# Patient Record
Sex: Male | Born: 1975 | Hispanic: No | Marital: Married
Health system: Midwestern US, Academic
[De-identification: ages and names within clinical notes are randomized; demographics above are authoritative.]

---

## 2018-04-25 ENCOUNTER — Encounter: Admit: 2018-04-25 | Discharge: 2018-04-26

## 2018-05-17 ENCOUNTER — Encounter: Admit: 2018-05-17 | Discharge: 2018-05-18

## 2018-11-14 ENCOUNTER — Encounter: Admit: 2018-11-14 | Discharge: 2018-11-14 | Payer: BC Managed Care – HMO

## 2018-11-20 ENCOUNTER — Encounter: Admit: 2018-11-20 | Discharge: 2018-11-20 | Payer: BC Managed Care – HMO

## 2018-11-20 DIAGNOSIS — M549 Dorsalgia, unspecified: Principal | ICD-10-CM

## 2018-11-22 ENCOUNTER — Ambulatory Visit: Admit: 2018-11-22 | Discharge: 2018-11-22 | Payer: BC Managed Care – HMO

## 2018-11-22 ENCOUNTER — Encounter: Admit: 2018-11-22 | Discharge: 2018-11-22 | Payer: BC Managed Care – HMO

## 2018-11-22 DIAGNOSIS — M549 Dorsalgia, unspecified: Principal | ICD-10-CM

## 2018-11-22 DIAGNOSIS — M48062 Spinal stenosis, lumbar region with neurogenic claudication: ICD-10-CM

## 2018-11-22 DIAGNOSIS — T7840XA Allergy, unspecified, initial encounter: ICD-10-CM

## 2018-11-22 DIAGNOSIS — F419 Anxiety disorder, unspecified: Principal | ICD-10-CM

## 2018-11-22 NOTE — Progress Notes
Date of Service: 11/22/2018     Subjective:               History of Present Illness    Troy Matthews is a 43 y.o. male.     Review of Systems   Musculoskeletal: Positive for arthralgias, back pain and neck stiffness.   All other systems reviewed and are negative.        Objective:         ??? diclofenac sodium DR (VOLTAREN) 75 mg tablet Take 75 mg by mouth twice daily.   ??? LORazepam (ATIVAN) 1 mg tablet Take 1 mg by mouth as Needed for Nausea, Vomiting or Other....   ??? pantoprazole DR (PROTONIX) 40 mg tablet Take 40 mg by mouth daily.     Vitals:    11/22/18 0813   BP: (!) 132/98   Pulse: 103   SpO2: 98%   Weight: 99.3 kg (219 lb)   Height: 172.7 cm (68)   PainSc: Six     Body mass index is 33.3 kg/m???.       Physical Exam  Ortho Exam         Assessment and Plan:    Oswestry Total Score:: 54

## 2018-11-22 NOTE — Progress Notes
Date of Service: 11/22/2018    Subjective:             Troy Matthews is a 43 y.o. male.    History of Present Illness  Mr. Pina is a 43 year old male who presents for new consultation of low back pain.  Patient has a past medical history of anxiety.  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  Reports pain predominantly lumbar region which occasional pain radiating to posterior buttocks and thigh region.  He reports about 7 months ago he was lifting a washer and dryer and developed worsening of his low back pain.  Since then he reports the pain has intensified.  He describes sharp low back pain that radiates to the bilateral buttocks and posterior thigh regions.  Reports the pain is intermittent and not constant.  He reports his alleviated by laying flat.  Reports is worse with standing upright and ambulating which he has to stop and rest for a while and it improves.  He denies bowel or bladder changes.  He denies lower extremity weakness.  He has attempted several conservative treatment options which have not provided long-term relief.  He has attempted physical therapy in 3 epidural steroid injections that which provided minimal relief at best.  He brings an MRI of the lumbar spine which is available for review today.     Review of Systems   Musculoskeletal: Positive for arthralgias, back pain and myalgias.   All other systems reviewed and are negative.    Medical History:   Diagnosis Date   ??? Allergies    ??? Anxiety      Surgical History:   Procedure Laterality Date   ??? WISDOM TEETH EXTRACTION       No Known Allergies      Objective:         ??? diclofenac sodium DR (VOLTAREN) 75 mg tablet Take 75 mg by mouth twice daily.   ??? LORazepam (ATIVAN) 1 mg tablet Take 1 mg by mouth as Needed for Nausea, Vomiting or Other....   ??? pantoprazole DR (PROTONIX) 40 mg tablet Take 40 mg by mouth daily.     Vitals:    11/22/18 0813   BP: (!) 132/98   Pulse: 103 SpO2: 98%   Weight: 99.3 kg (219 lb)   Height: 172.7 cm (68)   PainSc: Six     Body mass index is 33.3 kg/m???.     Physical Exam  Vitals signs reviewed.   Constitutional:       Appearance: Normal appearance.   HENT:      Head: Normocephalic and atraumatic.      Nose: Nose normal.   Eyes:      Extraocular Movements: Extraocular movements intact.      Pupils: Pupils are equal, round, and reactive to light.   Neck:      Musculoskeletal: Normal range of motion.   Cardiovascular:      Rate and Rhythm: Normal rate and regular rhythm.   Pulmonary:      Effort: Pulmonary effort is normal.      Breath sounds: Normal breath sounds.   Abdominal:      Palpations: Abdomen is soft.   Musculoskeletal: Normal range of motion.         General: No swelling, tenderness or deformity.   Skin:     General: Skin is warm and dry.      Capillary Refill:  Capillary refill takes less than 2 seconds.   Neurological:      Mental Status: He is alert. Mental status is at baseline.      Cranial Nerves: No cranial nerve deficit.      Sensory: No sensory deficit.      Motor: No weakness.      Coordination: Coordination normal.      Gait: Gait normal.      Deep Tendon Reflexes: Reflexes normal.      Comments: 5/5 strength bilateral upper extremities in shoulder abduction, EF, EE, wrist flexion, finger adduction  5/5 strength in HF, KE, KF, Df, PF, EHL     Psychiatric:         Mood and Affect: Mood normal.       MRI lumbar spine without contrast: Outside MRI lumbar spine reviewed.  Demonstrates multilevel spondylosis most prominently with L3-4 moderate central disc protrusion with ligamentum flavum hypertrophy compressing the thecal sac.  There is no obvious acute fractures.  There is normal alignment of the lumbar spine.       Assessment and Plan:  43 year old male who presents with chronic low back pain and bilateral buttock posterior thigh pain.  Experienced a exacerbation of his present

## 2018-12-01 ENCOUNTER — Ambulatory Visit: Admit: 2018-12-01 | Discharge: 2018-12-02 | Payer: BC Managed Care – HMO

## 2018-12-01 ENCOUNTER — Encounter: Admit: 2018-12-01 | Discharge: 2018-12-01 | Payer: BC Managed Care – HMO

## 2018-12-01 ENCOUNTER — Ambulatory Visit: Admit: 2018-12-01 | Discharge: 2018-12-01 | Payer: BC Managed Care – HMO

## 2018-12-01 DIAGNOSIS — F419 Anxiety disorder, unspecified: Principal | ICD-10-CM

## 2018-12-01 DIAGNOSIS — M1990 Unspecified osteoarthritis, unspecified site: ICD-10-CM

## 2018-12-01 DIAGNOSIS — T7840XA Allergy, unspecified, initial encounter: ICD-10-CM

## 2018-12-01 LAB — COMPREHENSIVE METABOLIC PANEL
Lab: 0.3 mg/dL — ABNORMAL LOW (ref 0.3–1.2)
Lab: 0.9 mg/dL (ref 0.4–1.24)
Lab: 10 mg/dL (ref 8.5–10.6)
Lab: 103 MMOL/L (ref 98–110)
Lab: 11 (ref 3–12)
Lab: 139 MMOL/L — ABNORMAL HIGH (ref 137–147)
Lab: 14 mg/dL (ref 7–25)
Lab: 20 U/L (ref 7–40)
Lab: 25 MMOL/L (ref 21–30)
Lab: 3.7 MMOL/L (ref 3.5–5.1)
Lab: 36 U/L (ref 7–56)
Lab: 4.9 g/dL (ref 3.5–5.0)
Lab: 8.1 g/dL — ABNORMAL HIGH (ref 6.0–8.0)
Lab: 81 mg/dL (ref 70–100)
Lab: 85 U/L (ref 25–110)
Lab: >60 mL/min (ref >60)
Lab: >60 mL/min (ref >60)

## 2018-12-01 LAB — CBC: Lab: 9.7 10*3/uL (ref 4.5–11.0)

## 2018-12-02 DIAGNOSIS — M549 Dorsalgia, unspecified: Principal | ICD-10-CM

## 2018-12-08 ENCOUNTER — Encounter: Admit: 2018-12-08 | Discharge: 2018-12-08 | Payer: BC Managed Care – HMO

## 2018-12-08 ENCOUNTER — Ambulatory Visit: Admit: 2018-12-08 | Discharge: 2018-12-08 | Payer: BC Managed Care – HMO

## 2018-12-08 DIAGNOSIS — M5126 Other intervertebral disc displacement, lumbar region: ICD-10-CM

## 2018-12-08 DIAGNOSIS — F419 Anxiety disorder, unspecified: ICD-10-CM

## 2018-12-08 DIAGNOSIS — M48062 Spinal stenosis, lumbar region with neurogenic claudication: Principal | ICD-10-CM

## 2018-12-08 DIAGNOSIS — M1990 Unspecified osteoarthritis, unspecified site: ICD-10-CM

## 2018-12-08 DIAGNOSIS — T7840XA Allergy, unspecified, initial encounter: ICD-10-CM

## 2018-12-08 MED ORDER — TRIAMCINOLONE ACETONIDE 40 MG/ML IJ SUSP
0 refills | Status: DC
Start: 2018-12-08 — End: 2018-12-08
  Administered 2018-12-08: 22:00:00 40 mg via INTRAMUSCULAR

## 2018-12-08 MED ORDER — HYDROMORPHONE (PF) 2 MG/ML IJ SYRG
0 refills | Status: DC
Start: 2018-12-08 — End: 2018-12-08
  Administered 2018-12-08: 22:00:00 1 mg via INTRAVENOUS

## 2018-12-08 MED ORDER — MIDAZOLAM 1 MG/ML IJ SOLN
INTRAVENOUS | 0 refills | Status: DC
Start: 2018-12-08 — End: 2018-12-08
  Administered 2018-12-08: 21:00:00 2 mg via INTRAVENOUS

## 2018-12-08 MED ORDER — LIDOCAINE (PF) 10 MG/ML (1 %) IJ SOLN
.1-2 mL | INTRAMUSCULAR | 0 refills | Status: DC | PRN
Start: 2018-12-08 — End: 2018-12-09
  Administered 2018-12-08: 19:00:00 0.2 mL via INTRAMUSCULAR

## 2018-12-08 MED ORDER — PROMETHAZINE 25 MG/ML IJ SOLN
6.25 mg | INTRAVENOUS | 0 refills | Status: DC | PRN
Start: 2018-12-08 — End: 2018-12-09

## 2018-12-08 MED ORDER — DEXTRAN 70-HYPROMELLOSE (PF) 0.1-0.3 % OP DPET
0 refills | Status: DC
Start: 2018-12-08 — End: 2018-12-08
  Administered 2018-12-08: 21:00:00 2 [drp] via OPHTHALMIC

## 2018-12-08 MED ORDER — DEXAMETHASONE SODIUM PHOSPHATE 4 MG/ML IJ SOLN
INTRAVENOUS | 0 refills | Status: DC
Start: 2018-12-08 — End: 2018-12-08
  Administered 2018-12-08: 21:00:00 4 mg via INTRAVENOUS

## 2018-12-08 MED ORDER — PROPOFOL INJ 10 MG/ML IV VIAL
INTRAVENOUS | 0 refills | Status: DC
Start: 2018-12-08 — End: 2018-12-08
  Administered 2018-12-08: 21:00:00 150 mg via INTRAVENOUS

## 2018-12-08 MED ORDER — FENTANYL CITRATE (PF) 50 MCG/ML IJ SOLN
50 ug | INTRAVENOUS | 0 refills | Status: DC | PRN
Start: 2018-12-08 — End: 2018-12-09

## 2018-12-08 MED ORDER — HALOPERIDOL LACTATE 5 MG/ML IJ SOLN
1 mg | Freq: Once | INTRAVENOUS | 0 refills | Status: DC | PRN
Start: 2018-12-08 — End: 2018-12-09

## 2018-12-08 MED ORDER — BUPIVACAINE-EPINEPHRINE 0.25 %-1:200,000 IJ SOLN
0 refills | Status: DC
Start: 2018-12-08 — End: 2018-12-08
  Administered 2018-12-08: 22:00:00 2 mL via INTRAMUSCULAR

## 2018-12-08 MED ORDER — OXYCODONE 5 MG PO TAB
5-10 mg | ORAL_TABLET | ORAL | 0 refills | 6.00000 days | Status: AC | PRN
Start: 2018-12-08 — End: 2019-02-15
  Filled 2018-12-08 (×2): qty 25, 2d supply, fill #1

## 2018-12-08 MED ORDER — THROMBIN (BOVINE) 5,000 UNIT TP SOLR
0 refills | Status: DC
Start: 2018-12-08 — End: 2018-12-08
  Administered 2018-12-08: 22:00:00 5000 [IU] via TOPICAL

## 2018-12-08 MED ORDER — ACETAMINOPHEN 500 MG PO TAB
1000 mg | Freq: Once | ORAL | 0 refills | Status: CP
Start: 2018-12-08 — End: ?
  Administered 2018-12-08: 19:00:00 1000 mg via ORAL

## 2018-12-08 MED ORDER — OXYCODONE 5 MG PO TAB
5-10 mg | Freq: Once | ORAL | 0 refills | Status: CP | PRN
Start: 2018-12-08 — End: ?
  Administered 2018-12-08: 22:00:00 10 mg via ORAL

## 2018-12-08 MED ORDER — SODIUM CHLORIDE 0.9 % IV SOLP
1000 mL | INTRAVENOUS | 0 refills | Status: DC
Start: 2018-12-08 — End: 2018-12-09

## 2018-12-08 MED ORDER — SUGAMMADEX 100 MG/ML IV SOLN
INTRAVENOUS | 0 refills | Status: DC
Start: 2018-12-08 — End: 2018-12-08
  Administered 2018-12-08: 22:00:00 200 mg via INTRAVENOUS

## 2018-12-08 MED ORDER — PROPOFOL 10 MG/ML IV EMUL (INFUSION)(AM)(OR)
INTRAVENOUS | 0 refills | Status: DC
Start: 2018-12-08 — End: 2018-12-08
  Administered 2018-12-08: 21:00:00 150 ug/kg/min via INTRAVENOUS

## 2018-12-08 MED ORDER — FENTANYL CITRATE (PF) 50 MCG/ML IJ SOLN
25 ug | INTRAVENOUS | 0 refills | Status: DC | PRN
Start: 2018-12-08 — End: 2018-12-09

## 2018-12-08 MED ORDER — LIDOCAINE (PF) 200 MG/10 ML (2 %) IJ SYRG
0 refills | Status: DC
Start: 2018-12-08 — End: 2018-12-08
  Administered 2018-12-08: 21:00:00 100 mg via INTRAVENOUS

## 2018-12-08 MED ORDER — BACITRACIN 50,000 UN NS 500 ML IRR BOT (OR)
0 refills | Status: DC
Start: 2018-12-08 — End: 2018-12-08
  Administered 2018-12-08 (×2): 500 mL

## 2018-12-08 MED ORDER — FENTANYL CITRATE (PF) 50 MCG/ML IJ SOLN
0 refills | Status: DC
Start: 2018-12-08 — End: 2018-12-08
  Administered 2018-12-08: 21:00:00 250 ug via INTRAVENOUS

## 2018-12-08 MED ORDER — GABAPENTIN 300 MG PO CAP
300 mg | Freq: Once | ORAL | 0 refills | Status: CP
Start: 2018-12-08 — End: ?
  Administered 2018-12-08: 19:00:00 300 mg via ORAL

## 2018-12-08 MED ORDER — ONDANSETRON HCL (PF) 4 MG/2 ML IJ SOLN
INTRAVENOUS | 0 refills | Status: DC
Start: 2018-12-08 — End: 2018-12-08
  Administered 2018-12-08: 21:00:00 4 mg via INTRAVENOUS

## 2018-12-08 MED ADMIN — SODIUM CHLORIDE 0.9 % IV SOLP [27838]: 1000 mL | INTRAVENOUS | @ 19:00:00 | Stop: 2018-12-08 | NDC 00338004904

## 2018-12-08 NOTE — Anesthesia Post-Procedure Evaluation
Post-Anesthesia Evaluation    Name: Troy Matthews      MRN: [id number]     DOB: [birth year]     Age: 43 y.o.     Sex: male   __________________________________________________________________________     Procedure Date: 12/08/2018  Procedure(s):  MINIMALLY INVASIVE LUMBAR 3 LAMINECTOMY      Surgeon: Surgeon(s):  Pablo Corona de [place], MD    Post-Anesthesia Vitals  BP: 131/93 (02/21 1645)  Temp: 36.6 ???C (97.9 ???F) (02/21 1645)  Pulse: 99 (02/21 1645)  Respirations: 12 PER MINUTE (02/21 1645)  SpO2: 97 % (02/21 1645)  SpO2 Pulse: 100 (02/21 1645)   Vitals Value Taken Time   BP 131/93 12/08/2018  4:45 PM   Temp 36.6 ???C (97.9 ???F) 12/08/2018  4:45 PM   Pulse 99 12/08/2018  4:45 PM   Respirations 12 PER MINUTE 12/08/2018  4:45 PM   SpO2 97 % 12/08/2018  4:45 PM         Post Anesthesia Evaluation Note    Evaluation location: Pre/Post  Patient participation: recovered; patient participated in evaluation  Level of consciousness: alert    Pain score: 3 (Tolerable per patient)  Pain management: adequate    Hydration: normovolemia  Temperature: 36.0???C - 38.4???C  Airway patency: adequate    Perioperative Events       Post-op nausea and vomiting: no PONV    Postoperative Status  Cardiovascular status: hemodynamically stable  Respiratory status: spontaneous ventilation  Follow-up needed: none        Perioperative Events  Perioperative Event: No  Emergency Case Activation: No

## 2018-12-10 ENCOUNTER — Encounter: Admit: 2018-12-10 | Discharge: 2018-12-10 | Payer: BC Managed Care – HMO

## 2018-12-10 DIAGNOSIS — F419 Anxiety disorder, unspecified: Principal | ICD-10-CM

## 2018-12-10 DIAGNOSIS — T7840XA Allergy, unspecified, initial encounter: ICD-10-CM

## 2018-12-10 DIAGNOSIS — M1990 Unspecified osteoarthritis, unspecified site: ICD-10-CM

## 2018-12-12 ENCOUNTER — Encounter: Admit: 2018-12-12 | Discharge: 2018-12-12 | Payer: BC Managed Care – HMO

## 2018-12-12 NOTE — Telephone Encounter
Patient called to request a refill of his oxycodone.  He states he is still having some pain when he is up moving around.    Patient informed Dr. Earlene Plater will be notified of the request for pain medication refill.

## 2018-12-13 ENCOUNTER — Encounter: Admit: 2018-12-13 | Discharge: 2018-12-13 | Payer: BC Managed Care – HMO

## 2018-12-13 MED ORDER — HYDROCODONE-ACETAMINOPHEN 5-325 MG PO TAB
1-2 | ORAL_TABLET | ORAL | 0 refills | 30.00000 days | Status: AC | PRN
Start: 2018-12-13 — End: 2019-01-01

## 2018-12-25 ENCOUNTER — Encounter: Admit: 2018-12-25 | Discharge: 2018-12-25 | Payer: BC Managed Care – HMO

## 2018-12-25 NOTE — Telephone Encounter
Pt called scheduling line to cancel POV for a wound check due to being ill.  Scheduling dept rescheduled appt to 01/01/19  I attempted to call pt back no answer.  Lm on pts vm to call clinic to discuss general questions about incisional care and rule out any concerns of infection.  Clinic number provided on pts vm  Call ended

## 2019-01-01 ENCOUNTER — Ambulatory Visit: Admit: 2019-01-01 | Discharge: 2019-01-02 | Payer: BC Managed Care – HMO

## 2019-01-01 ENCOUNTER — Encounter: Admit: 2019-01-01 | Discharge: 2019-01-01 | Payer: BC Managed Care – HMO

## 2019-01-01 DIAGNOSIS — F419 Anxiety disorder, unspecified: Principal | ICD-10-CM

## 2019-01-01 DIAGNOSIS — M1990 Unspecified osteoarthritis, unspecified site: ICD-10-CM

## 2019-01-01 DIAGNOSIS — Z5189 Encounter for other specified aftercare: Principal | ICD-10-CM

## 2019-01-01 DIAGNOSIS — T7840XA Allergy, unspecified, initial encounter: ICD-10-CM

## 2019-01-01 MED ORDER — HYDROCODONE-ACETAMINOPHEN 5-325 MG PO TAB
1 | ORAL_TABLET | ORAL | 0 refills | 30.00000 days | Status: AC | PRN
Start: 2019-01-01 — End: 2019-05-23

## 2019-01-01 NOTE — Progress Notes
Neurosurgery Clinic Follow-up    Patient Active Problem List    Diagnosis Date Noted   ??? 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 Earlene Plater on 12/08/18. He reports his anterior thigh pain has decreased.  He continues to have moderate levels of bilateral hip and lumbar pain.  He would like a pain medication refill.  He is eating/drinking well.  Denies constipation.   Denies redness/drainage/swelling of his incision.      Physical Exam     Alert/Fully Oriented  Neuro Intact and Stable  Motor/Strength 5/5  Gait Steady   Incision edges well approximated.  No S/S of infection noted.     Mr. General returns today for 2 wk f/u.  Overall, he's doing well. Incision healing well.  Discussed post op wound care.   He endorses continued incisional pain.  Will give him one small refill of Norco.   Encouraged to take Tylenol unless he's having moderate pain   Encouraged to increase his activity and begin walking small amounts.  He should refrain from lifting more than 10# and only minimal twisting or bending.  He voices understanding.  He will return for surgical follow up with Dr. Earlene Plater on 01/24/19.          Please call [phone] with questions or concern    Troy Laurence, APRN-NP

## 2019-01-17 ENCOUNTER — Encounter: Admit: 2019-01-17 | Discharge: 2019-01-17 | Payer: BC Managed Care – HMO

## 2019-01-24 ENCOUNTER — Encounter: Admit: 2019-01-24 | Discharge: 2019-01-24 | Payer: BC Managed Care – HMO

## 2019-01-24 ENCOUNTER — Ambulatory Visit: Admit: 2019-01-24 | Discharge: 2019-01-25 | Payer: BC Managed Care – HMO

## 2019-01-24 DIAGNOSIS — F419 Anxiety disorder, unspecified: Principal | ICD-10-CM

## 2019-01-24 DIAGNOSIS — T7840XA Allergy, unspecified, initial encounter: ICD-10-CM

## 2019-01-24 DIAGNOSIS — M1990 Unspecified osteoarthritis, unspecified site: ICD-10-CM

## 2019-01-24 NOTE — Progress Notes
???   oxyCODONE (ROXICODONE) 5 mg tablet Take one tablet to two tablets by mouth every 4 hours as needed for Pain   ??? pantoprazole DR (PROTONIX) 40 mg tablet Take 40 mg by mouth daily as needed.     Vitals:    01/24/19 1423   Weight: 98.9 kg (218 lb)   Height: 172.7 cm (68)   PainSc: Six     Body mass index is 33.15 kg/m???.              Assessment and Plan:  I think this patient has recovered well from his surgery.  His radicular and clot agenic pain has resolved.  He still has chronic back pain, for which I am prescribing physical therapy and referring him to pain management.  I will see him back on an as-needed basis.

## 2019-01-25 DIAGNOSIS — M48062 Spinal stenosis, lumbar region with neurogenic claudication: Principal | ICD-10-CM

## 2019-02-02 ENCOUNTER — Encounter: Admit: 2019-02-02 | Discharge: 2019-02-02 | Payer: BC Managed Care – HMO

## 2019-02-12 ENCOUNTER — Encounter: Admit: 2019-02-12 | Discharge: 2019-02-12 | Payer: BC Managed Care – HMO

## 2019-02-15 ENCOUNTER — Ambulatory Visit: Admit: 2019-02-15 | Discharge: 2019-02-16 | Payer: BC Managed Care – HMO

## 2019-02-15 ENCOUNTER — Encounter: Admit: 2019-02-15 | Discharge: 2019-02-15 | Payer: BC Managed Care – HMO

## 2019-02-15 DIAGNOSIS — M2550 Pain in unspecified joint: ICD-10-CM

## 2019-02-15 DIAGNOSIS — T7840XA Allergy, unspecified, initial encounter: ICD-10-CM

## 2019-02-15 DIAGNOSIS — M1990 Unspecified osteoarthritis, unspecified site: ICD-10-CM

## 2019-02-15 DIAGNOSIS — F419 Anxiety disorder, unspecified: Principal | ICD-10-CM

## 2019-02-15 DIAGNOSIS — M5136 Other intervertebral disc degeneration, lumbar region: ICD-10-CM

## 2019-02-15 DIAGNOSIS — M4800 Spinal stenosis, site unspecified: ICD-10-CM

## 2019-02-16 DIAGNOSIS — M48062 Spinal stenosis, lumbar region with neurogenic claudication: Principal | ICD-10-CM

## 2019-02-16 DIAGNOSIS — M5416 Radiculopathy, lumbar region: ICD-10-CM

## 2019-03-20 ENCOUNTER — Encounter: Admit: 2019-03-20 | Discharge: 2019-03-20

## 2019-03-20 ENCOUNTER — Ambulatory Visit: Admit: 2019-03-20 | Discharge: 2019-03-20

## 2019-03-20 DIAGNOSIS — M48062 Spinal stenosis, lumbar region with neurogenic claudication: Secondary | ICD-10-CM

## 2019-03-20 DIAGNOSIS — M5416 Radiculopathy, lumbar region: Secondary | ICD-10-CM

## 2019-03-23 ENCOUNTER — Encounter: Admit: 2019-03-23 | Discharge: 2019-03-23

## 2019-03-23 NOTE — Telephone Encounter
Patient is calling for results of the MRI   Currently scheduled for a Bil L3-4 TFESI on 03/29/2019

## 2019-03-26 NOTE — Telephone Encounter
will close the encounter as MRI results were sent by provider through mychart.

## 2019-03-28 ENCOUNTER — Encounter: Admit: 2019-03-28 | Discharge: 2019-03-28

## 2019-03-28 DIAGNOSIS — M48062 Spinal stenosis, lumbar region with neurogenic claudication: Secondary | ICD-10-CM

## 2019-03-29 ENCOUNTER — Encounter: Admit: 2019-03-29 | Discharge: 2019-03-29

## 2019-03-29 DIAGNOSIS — F419 Anxiety disorder, unspecified: Secondary | ICD-10-CM

## 2019-03-29 DIAGNOSIS — M4800 Spinal stenosis, site unspecified: Secondary | ICD-10-CM

## 2019-03-29 DIAGNOSIS — M1990 Unspecified osteoarthritis, unspecified site: Secondary | ICD-10-CM

## 2019-03-29 DIAGNOSIS — M2550 Pain in unspecified joint: Secondary | ICD-10-CM

## 2019-03-29 DIAGNOSIS — T7840XA Allergy, unspecified, initial encounter: Secondary | ICD-10-CM

## 2019-03-29 DIAGNOSIS — M48062 Spinal stenosis, lumbar region with neurogenic claudication: Principal | ICD-10-CM

## 2019-03-29 DIAGNOSIS — M5136 Other intervertebral disc degeneration, lumbar region: Secondary | ICD-10-CM

## 2019-03-29 MED ORDER — BUPIVACAINE (PF) 0.25 % (2.5 MG/ML) IJ SOLN
2 mL | Freq: Once | INTRAMUSCULAR | 0 refills | Status: CP
Start: 2019-03-29 — End: ?
  Administered 2019-03-29: 15:00:00 2 mL via INTRAMUSCULAR

## 2019-03-29 MED ORDER — IOHEXOL 240 MG IODINE/ML IV SOLN
2.5 mL | Freq: Once | EPIDURAL | 0 refills | Status: CP
Start: 2019-03-29 — End: ?
  Administered 2019-03-29: 15:00:00 2.5 mL via EPIDURAL

## 2019-03-29 MED ORDER — METHYLPREDNISOLONE ACETATE 80 MG/ML IJ SUSP
80 mg | Freq: Once | INTRAMUSCULAR | 0 refills | Status: CP
Start: 2019-03-29 — End: ?
  Administered 2019-03-29: 15:00:00 80 mg via INTRAMUSCULAR

## 2019-03-29 NOTE — Progress Notes
[facility]  INTERVENTIONAL PAIN PROCEDURE HISTORY AND PHYSICAL    Chief Complaint   Patient presents with   ??? Lower Back - Pain       HISTORY OF PRESENT ILLNESS:  pain    Medical History:   Diagnosis Date   ??? Allergies     Environmental, not current   ??? Anxiety    ??? Arthritis     Back   ??? 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 Joint pain Have had for long time   ??? Spinal stenosis 04/17/2018       Surgical History:   Procedure Laterality Date   ??? LAMINECTOMY  12/08/2018   ??? MINIMALLY INVASIVE LUMBAR 3 LAMINECTOMY N/A 12/08/2018    Performed by Pablo [place], MD at [facility]   ??? WISDOM TEETH EXTRACTION         family history includes Osteoporosis in his mother.    Social History     Socioeconomic History   ??? Marital status: Married     Spouse name: Not on file   ??? Number of children: Not on file   ??? Years of education: Not on file   ??? Highest education level: Not on file   Occupational History   ??? Not on file   Tobacco Use   ??? Smoking status: Current Some Day Smoker     Packs/day: 0.00     Years: 3.00     Pack years: 0.00     Types: Cigars   ??? Smokeless tobacco: Never Used   ??? Tobacco comment: Maybe 2-3 a week   Substance and Sexual Activity   ??? Alcohol use: Yes     Alcohol/week: 14.0 standard drinks     Types: 14 Cans of beer per week     Frequency: 4 or more times a week     Drinks per session: 1 or 2     Binge frequency: Daily or almost daily   ??? Drug use: Never   ??? Sexual activity: Yes     Partners: Female   Other Topics Concern   ??? Not on file   Social History Narrative   ??? Not on file       No Known Allergies    Vitals:    03/29/19 0931   BP: (!) 132/97   BP Source: Arm, Right Upper   Patient Position: Sitting   Pulse: 79   Resp: 18   Temp: 37 ???C (98.6 ???F)   TempSrc: Oral   SpO2: 99%   Weight: 102.1 kg (225 lb)   Height: 174 cm (68.5)   PainSc: Seven       REVIEW OF SYSTEMS: 10 point ROS obtained and negative except pain      PHYSICAL EXAM:General Appearance: moderately overweight, no distress Respiratory Effort: breathing comfortably, no respiratory distress   Pedal Pulses: normal symmetric pedal pulses   Lower Extremity Edema: no lower extremity edema   Abdominal Exam: mildly protuberant but soft, non-tender, no masses, bowel sounds normal   Gait & Station: walks without assistance   Muscle Strength: normal muscle tone   Orientation: oriented to time, place and person   Affect & Mood: appropriate and sustained affect   Language and Memory: patient responsive and seems to comprehend information   Neurologic Exam: neurological assessment grossly intact   Other: moves all extremities          IMPRESSION:    1. Lumbar stenosis with neurogenic claudication  2. Lumbar radiculopathy         PLAN: Lumbar Transforaminal Steroid Injection

## 2019-03-29 NOTE — Patient Instructions
Procedure Completed Today: Lumbar Epidural Steroid Injection    Important information following your procedure today: You may drive today    1. Pain relief may not be immediate. It is possible you may even experience an increase in pain during the first 24-48 hours followed by a gradual decrease of your pain.  2. Though the procedure is generally safe and complications are rare, we do ask that you be aware of any of the following:   ? Any swelling, persistent redness, new bleeding, or drainage from the site of the injection.  ? You should not experience a severe headache.  ? You should not run a fever over 101??? F.  ? New onset of sharp, severe back & or neck pain.  ? New onset of upper or lower extremity numbness or weakness.  ? New difficulty controlling bowel or bladder function after the injection.  ? New shortness of breath.    If any of these occur, please call to report this occurrence to a nurse at [phone]. If you are calling after 4:00 p.m., on weekends or holidays please call [phone] and ask to have the resident physician on call for the physician paged or go to your local emergency room.  3. You may experience soreness at the injection site. Ice can be applied at 20 minute intervals. Avoid application of direct heat, hot showers or hot tubs today.  4. Avoid strenuous activity today. You may resume your regular activities and exercise tomorrow.  5. Patients with diabetes may see an elevation in blood sugars for 7-10 days after the injection. It is important to pay close attention to your diet, check your blood sugars daily and report extreme elevations to the physician that treats your diabetes.  6. Patients taking a daily blood thinner can resume their regular dose this evening.  7. It is important that you take all medications ordered by your pain physician. Taking medication as ordered is an important part of your pain care plan. If you cannot continue the medication plan, please notify the physician.     Possible side effects to steroids that may occur:  ? Flushing or redness of the face  ? Irritability  ? Fluid retention  ? Change in women???s menses    The following medications were used: Bupivicaine  , Depomedrol and Contrast Dye

## 2019-03-29 NOTE — Procedures
Attending Surgeon: Jerilynn Som, MD    Anesthesia: Local    Pre-Procedure Diagnosis:   1. Lumbar stenosis with neurogenic claudication    2. Lumbar radiculopathy        Post-Procedure Diagnosis:   1. Lumbar stenosis with neurogenic claudication    2. Lumbar radiculopathy        [facility] INJECT SNRB/TFESI LUMBAR/SACRAL  Procedure: transforaminal epidural    Laterality: bilateral    Location: lumbar - L3-4      Consent:   Consent obtained: verbal and written  Consent given by: patient  Risks discussed: allergic reaction, bleeding, bruising and infection  Alternatives discussed: alternative treatment, delayed treatment and no treatment  Discussed with patient the purpose of the treatment/procedure, other ways of treating my condition, including no treatment/ procedure and the risks and benefits of the alternatives. Patient has decided to proceed with treatment/procedure.        Universal Protocol:  Relevant documents: relevant documents present and verified  Test results: test results available and properly labeled  Imaging studies: imaging studies available  Required items: required blood products, implants, devices, and special equipment available  Site marked: the operative site was marked  Patient identity confirmed: Patient identify confirmed verbally with patient.        Time out: Immediately prior to procedure a time out was called to verify the correct patient, procedure, equipment, support staff and site/side marked as required      Procedures Details:   Indications: pain and diagnostic evaluation   Prep: Betadine  Patient position: prone  Estimated Blood Loss: minimal  Specimens: none  Number of Levels: 2  Approach: right paramedian  Guidance: fluoroscopy  Contrast: Procedure confirmed with contrast under live fluoroscopy.  Needle and Epidural Catheter: pencil-tip  Needle size: 25 G  Injection procedure: Introduced needle  Patient tolerance: Patient tolerated the procedure well with no immediate complications. Pressure was applied, and hemostasis was accomplished.  Outcome: Pain improved  Comments: INTERVENTIONAL PAIN MANAGEMENT PROCEDURE REPORT    Lumbar Transforaminal Epidural Injection    Date of Service: @ED @    Procedure Title(s):    1. Bilateral L3-L4 transforaminal epidural injection   2. Intraoperative fluoroscopy     Attending Surgeon: @ENCPROVNMTITLE @    Anesthesia: Local           Anxiolysis No           Procedural Sedation No    Indications: Troy Matthews is a 43 y.o. male with a diagnosis of lumbar radicular pain, disc bulge. The patient's history and physical exam were reviewed. The risks, benefits and alternatives to the procedure were discussed, and all questions were answered to the patient's satisfaction. The patient agreed to proceed, and written informed consent was obtained.     Procedure in Detail: IV was started? No    The patient was brought into the procedure room and placed in the prone position on the fluoroscopy table. Standard monitors were placed, and vital signs were observed throughout the procedure. The area of the lumbar spine was prepped with betadine and draped in a sterile manner. The L3-L4 vertebral bodies were identified with AP fluoroscopy. An oblique view to the right then right  was obtained to better visualize the inferior junction of the pedicle and transverse process. The 6 o???clock position of the pedicle was marked and identified. The skin and subcutaneous tissues in the area were anesthetized with 1% lidocaine. A 25-gauge, 3.5 inch needle was directed toward the targeted point  under fluoroscopy until bone was contacted. The needle was then walked inferiorly until the neural foramen was entered. A lateral fluoroscopic view was then used to place the needle tip at the 10 o???clock position of the foramen.     Negative aspiration was confirmed, and 1ml of contrast was injected at each level. Appropriate neurograms were observed under live AP fluoroscopy with no noted vascular or intrathecal uptake. Then, after negative aspiration, a solution consisting of 1mL 0.25% bupivacaine and  40 (steroid) mg was easily injected at each level. The needles were removed with a 1% lidocaine flush. The patient???s back was cleaned and a bandage was placed over the needle insertion points.    The same procedure was performed on the opposite side? Yes    Disposition: The patient tolerated the procedure well, and there were no apparent complications. Vital signs remained stable througtout the procedure. The patient was taken to the recovery area where discharge instructions for the procedure were given.     Estimated Blood Loss: minimal    Specimens: none    Complications: none        Estimated blood loss: none or minimal  Specimens: none  Patient tolerated the procedure well with no immediate complications. Pressure was applied, and hemostasis was accomplished.

## 2019-03-29 NOTE — Progress Notes

## 2019-03-30 ENCOUNTER — Ambulatory Visit: Admit: 2019-03-29 | Discharge: 2019-03-30

## 2019-03-30 DIAGNOSIS — F17290 Nicotine dependence, other tobacco product, uncomplicated: Secondary | ICD-10-CM

## 2019-03-30 DIAGNOSIS — M5416 Radiculopathy, lumbar region: Secondary | ICD-10-CM

## 2019-05-09 ENCOUNTER — Encounter: Admit: 2019-05-09 | Discharge: 2019-05-09

## 2019-05-09 DIAGNOSIS — M48062 Spinal stenosis, lumbar region with neurogenic claudication: Secondary | ICD-10-CM

## 2019-05-23 ENCOUNTER — Encounter: Admit: 2019-05-23 | Discharge: 2019-05-23

## 2019-05-24 ENCOUNTER — Encounter: Admit: 2019-05-24 | Discharge: 2019-05-24

## 2019-05-24 DIAGNOSIS — M5416 Radiculopathy, lumbar region: Secondary | ICD-10-CM

## 2019-05-24 DIAGNOSIS — M48062 Spinal stenosis, lumbar region with neurogenic claudication: Secondary | ICD-10-CM

## 2019-05-24 DIAGNOSIS — M2550 Pain in unspecified joint: Secondary | ICD-10-CM

## 2019-05-24 DIAGNOSIS — M4800 Spinal stenosis, site unspecified: Secondary | ICD-10-CM

## 2019-05-24 DIAGNOSIS — F419 Anxiety disorder, unspecified: Secondary | ICD-10-CM

## 2019-05-24 DIAGNOSIS — T7840XA Allergy, unspecified, initial encounter: Secondary | ICD-10-CM

## 2019-05-24 DIAGNOSIS — M1990 Unspecified osteoarthritis, unspecified site: Secondary | ICD-10-CM

## 2019-05-24 DIAGNOSIS — M5136 Other intervertebral disc degeneration, lumbar region: Secondary | ICD-10-CM

## 2019-05-24 MED ORDER — METHYLPREDNISOLONE ACETATE 80 MG/ML IJ SUSP
80 mg | Freq: Once | INTRAMUSCULAR | 0 refills | Status: CP
Start: 2019-05-24 — End: ?
  Administered 2019-05-24: 18:00:00 80 mg via INTRAMUSCULAR

## 2019-05-24 MED ORDER — IOHEXOL 240 MG IODINE/ML IV SOLN
2.5 mL | Freq: Once | EPIDURAL | 0 refills | Status: CP
Start: 2019-05-24 — End: ?
  Administered 2019-05-24: 18:00:00 2.5 mL via EPIDURAL

## 2019-05-24 NOTE — Progress Notes

## 2019-05-24 NOTE — Procedures
Attending Surgeon: Jerilynn Som, MD    Anesthesia: Local    Pre-Procedure Diagnosis:   1. Lumbar stenosis with neurogenic claudication    2. Lumbar radiculopathy        Post-Procedure Diagnosis:   1. Lumbar stenosis with neurogenic claudication    2. Lumbar radiculopathy        [facility] INJECT INTERLAM LMBR/SAC  Procedure: epidural - interlaminar    Laterality: n/a    Location: lumbar ESI with imaging - L4-5      Consent:   Consent obtained: written  Consent given by: patient  Risks discussed: allergic reaction, bleeding, bruising, infection, no change or worsening in pain, reaction to medication and weakness  Alternatives discussed: alternative treatment, delayed treatment, no treatment and referral  Discussed with patient the purpose of the treatment/procedure, other ways of treating my condition, including no treatment/ procedure and the risks and benefits of the alternatives. Patient has decided to proceed with treatment/procedure.        Universal Protocol:  Relevant documents: relevant documents present and verified  Test results: test results available and properly labeled  Imaging studies: imaging studies available  Required items: required blood products, implants, devices, and special equipment available  Site marked: the operative site was marked  Patient identity confirmed: Patient identify confirmed verbally with patient.        Time out: Immediately prior to procedure a time out was called to verify the correct patient, procedure, equipment, support staff and site/side marked as required      Procedures Details:   Indications: pain   Prep: Betadine  Patient position: prone  Estimated Blood Loss: minimal  Specimens: none  Number of Levels: 1  Approach: midline  Guidance: fluoroscopy  Contrast: Procedure confirmed with contrast under live fluoroscopy.  Needle and Epidural Catheter: tuohy  Needle size: 18 G  Injection procedure: Incremental injection, Negative aspiration for blood and Introduced needle  Patient tolerance: Patient tolerated the procedure well with no immediate complications. Pressure was applied, and hemostasis was accomplished.  Outcome: Pain improved  Comments: 4ml NS and 80mg  depomedrol  No complications        Estimated blood loss: none or minimal  Specimens: none  Patient tolerated the procedure well with no immediate complications. Pressure was applied, and hemostasis was accomplished.

## 2019-05-24 NOTE — Patient Instructions
Procedure Completed Today: Lumbar Epidural Steroid Injection    Important information following your procedure today: You may drive today    1. Pain relief may not be immediate. It is possible you may even experience an increase in pain during the first 24-48 hours followed by a gradual decrease of your pain.  2. Though the procedure is generally safe and complications are rare, we do ask that you be aware of any of the following:   ? Any swelling, persistent redness, new bleeding, or drainage from the site of the injection.  ? You should not experience a severe headache.  ? You should not run a fever over 101??? F.  ? New onset of sharp, severe back & or neck pain.  ? New onset of upper or lower extremity numbness or weakness.  ? New difficulty controlling bowel or bladder function after the injection.  ? New shortness of breath.    If any of these occur, please call to report this occurrence to a nurse at [phone]. If you are calling after 4:00 p.m., on weekends or holidays please call [phone] and ask to have the resident physician on call for the physician paged or go to your local emergency room.  3. You may experience soreness at the injection site. Ice can be applied at 20 minute intervals. Avoid application of direct heat, hot showers or hot tubs today.  4. Avoid strenuous activity today. You may resume your regular activities and exercise tomorrow.  5. Patients with diabetes may see an elevation in blood sugars for 7-10 days after the injection. It is important to pay close attention to your diet, check your blood sugars daily and report extreme elevations to the physician that treats your diabetes.  6. Patients taking a daily blood thinner can resume their regular dose this evening.  7. It is important that you take all medications ordered by your pain physician. Taking medication as ordered is an important part of your pain care plan. If you cannot continue the medication plan, please notify the physician.     Possible side effects to steroids that may occur:  ? Flushing or redness of the face  ? Irritability  ? Fluid retention  ? Change in women???s menses    The following medications were used: Lidocaine , Depomedrol and Contrast Dye

## 2019-05-24 NOTE — Progress Notes
SPINE CENTER  INTERVENTIONAL PAIN PROCEDURE HISTORY AND PHYSICAL    Chief Complaint   Patient presents with   ??? Left Leg - Pain   ??? Procedure       HISTORY OF PRESENT ILLNESS:  pain    Medical History:   Diagnosis Date   ??? Allergies     Environmental, not current   ??? Anxiety    ??? Arthritis     Back   ??? 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 Joint pain Have had for long time   ??? Spinal stenosis 04/17/2018       Surgical History:   Procedure Laterality Date   ??? LAMINECTOMY  12/08/2018   ??? MINIMALLY INVASIVE LUMBAR 3 LAMINECTOMY N/A 12/08/2018    Performed by Pablo [place], MD at [facility]   ??? WISDOM TEETH EXTRACTION         family history includes Osteoporosis in his mother.    Social History     Socioeconomic History   ??? Marital status: Married     Spouse name: Not on file   ??? Number of children: Not on file   ??? Years of education: Not on file   ??? Highest education level: Not on file   Occupational History   ??? Not on file   Tobacco Use   ??? Smoking status: Current Some Day Smoker     Packs/day: 0.00     Years: 3.00     Pack years: 0.00     Types: Cigars   ??? Smokeless tobacco: Never Used   ??? Tobacco comment: Maybe 2-3 a week   Substance and Sexual Activity   ??? Alcohol use: Yes     Alcohol/week: 14.0 standard drinks     Types: 14 Cans of beer per week     Frequency: 4 or more times a week     Drinks per session: 1 or 2     Binge frequency: Daily or almost daily   ??? Drug use: Never   ??? Sexual activity: Yes     Partners: Female   Other Topics Concern   ??? Not on file   Social History Narrative   ??? Not on file       No Known Allergies    Vitals:    05/24/19 1147   BP: (!) 128/94   BP Source: Arm, Right Upper   Patient Position: Sitting   Pulse: 104   Resp: 16   Temp: 36.7 ???C (98.1 ???F)   TempSrc: Oral   SpO2: 98%   Weight: 100.2 kg (221 lb)   PainSc: Five       REVIEW OF SYSTEMS: 10 point ROS obtained and negative except pain      PHYSICAL EXAM:General Appearance: moderately overweight, no distress Respiratory Effort: breathing comfortably, no respiratory distress   Pedal Pulses: normal symmetric pedal pulses   Lower Extremity Edema: no lower extremity edema   Abdominal Exam: mildly protuberant but soft, non-tender, no masses, bowel sounds normal   Gait & Station: walks without assistance   Muscle Strength: normal muscle tone   Orientation: oriented to time, place and person   Affect & Mood: appropriate and sustained affect   Language and Memory: patient responsive and seems to comprehend information   Neurologic Exam: neurological assessment grossly intact   Other: moves all extremities          IMPRESSION:    1. Lumbar stenosis with neurogenic claudication    2. Lumbar  radiculopathy         PLAN: Lumbar Epidural Steroid Injection

## 2019-05-25 ENCOUNTER — Ambulatory Visit: Admit: 2019-05-24 | Discharge: 2019-05-25

## 2019-05-25 DIAGNOSIS — M48062 Spinal stenosis, lumbar region with neurogenic claudication: Secondary | ICD-10-CM

## 2019-05-25 DIAGNOSIS — F17290 Nicotine dependence, other tobacco product, uncomplicated: Secondary | ICD-10-CM

## 2019-05-25 DIAGNOSIS — M5416 Radiculopathy, lumbar region: Secondary | ICD-10-CM

## 2019-07-27 ENCOUNTER — Encounter: Admit: 2019-07-27 | Discharge: 2019-07-27 | Payer: BC Managed Care – HMO

## 2019-07-27 ENCOUNTER — Ambulatory Visit: Admit: 2019-07-27 | Discharge: 2019-07-27 | Payer: BC Managed Care – HMO

## 2019-07-27 DIAGNOSIS — M4800 Spinal stenosis, site unspecified: Secondary | ICD-10-CM

## 2019-07-27 DIAGNOSIS — M5416 Radiculopathy, lumbar region: Secondary | ICD-10-CM

## 2019-07-27 DIAGNOSIS — M48062 Spinal stenosis, lumbar region with neurogenic claudication: Secondary | ICD-10-CM

## 2019-07-27 DIAGNOSIS — F419 Anxiety disorder, unspecified: Secondary | ICD-10-CM

## 2019-07-27 DIAGNOSIS — M2550 Pain in unspecified joint: Secondary | ICD-10-CM

## 2019-07-27 DIAGNOSIS — M5136 Other intervertebral disc degeneration, lumbar region: Secondary | ICD-10-CM

## 2019-07-27 DIAGNOSIS — M1990 Unspecified osteoarthritis, unspecified site: Secondary | ICD-10-CM

## 2019-07-27 DIAGNOSIS — T7840XA Allergy, unspecified, initial encounter: Secondary | ICD-10-CM

## 2019-07-27 MED ORDER — GABAPENTIN 600 MG PO TAB
600 mg | ORAL_TABLET | ORAL | 3 refills | Status: AC
Start: 2019-07-27 — End: ?

## 2019-07-27 NOTE — Progress Notes
SPINE CENTER CLINIC NOTE       SUBJECTIVE:   Chronic back pain and leg pain  Patient was last seen in August for injections  He underwent L3 and L4 transforaminal injections and reports no significant relief from these treatments  Previously he underwent epidural injections in [place] with mild relief but not sustaining  Patient has undergone surgical treatment with Dr. Earlene Plater in neurosurgery with partial laminectomy at L3  His leg pain was mildly better after the treatment but his back pain persisted  He reports that his pain is been ongoing limiting his ability to walk and function and affecting his mood.  He has had underlying anxiety in the past and is currently getting treated with Valium by his primary care provider  Currently the patient is taking oxycodone 15 mg a couple to few times per day sparingly for pain control  He finds that the medication helps mildly but the significance of his pain does affect his functionality  He has undergone physical therapy in the past with limited relief    He comes in to discuss treatment options           Review of Systems   Constitutional: Negative.    HENT: Negative.    Eyes: Negative.    Respiratory: Negative.    Cardiovascular: Negative.    Gastrointestinal: Negative.    Endocrine: Negative.    Genitourinary: Negative.    Musculoskeletal: Positive for back pain and myalgias.   Skin: Negative.    Allergic/Immunologic: Negative.    Neurological: Negative.    Hematological: Negative.    Psychiatric/Behavioral: Negative.        Current Outpatient Medications:   ?  cyclobenzaprine (FLEXERIL) 10 mg tablet, Take 10 mg by mouth at bedtime daily., Disp: , Rfl:   ?  diazePAM (VALIUM) 5 mg tablet, Take 5 mg by mouth as Needed for Anxiety., Disp: , Rfl:   ?  duloxetine DR (CYMBALTA) 30 mg capsule, Take 30 mg by mouth daily., Disp: , Rfl:   ?  ETODOLAC PO, Take 400 mg by mouth twice daily as needed., Disp: , Rfl: ?  gabapentin (NEURONTIN) 600 mg tablet, Take one tablet by mouth every 8 hours. Indications: neuropathic pain, Disp: 90 tablet, Rfl: 3  ?  oxyCODONE (ROXICODONE) 15 mg tablet, Take 15 mg by mouth three times daily, Disp: , Rfl:   ?  pantoprazole DR (PROTONIX) 40 mg tablet, Take 40 mg by mouth daily as needed., Disp: , Rfl:   No Known Allergies  Physical Exam  Vitals:    07/27/19 1058   BP: (!) 135/94   Pulse: 89   SpO2: 97%   Weight: 100.2 kg (221 lb)   Height: 174 cm (68.5)   PainSc: Eight     Oswestry Total Score:: 86  Pain Score: Eight  Body mass index is 33.11 kg/m?Marland Kitchen    General: Alert, oriented, mild distress  HEENT: normocephalic  Resp: Non labored breathing, no distress  Cardio: Pedal pulses palpable bilaterally, mild lower extremity edema  MS: TTP in lumbar region of spine and paraspinal muscles.  NEURO: Cranial nerves II-XII intact. Motor strength 4+ bil hip flexion, sensory exams normal. . Gait antalgic with cane.   Behavior: Calm, cooperative, behavior and speech normal.           IMPRESSION:  1. Lumbar radiculopathy    2. Lumbar stenosis with neurogenic claudication    3. Anxiety          PLAN: Reviewed imaging  with the patient and MRI findings  Discussed treatment options including conservative physical therapy and medication optimization  Suggest increasing gabapentin to 600 mg 3 times a day and a new prescription was given for this  Discussed risks and benefits side effects of the medication  Patient has not had significant response to epidural steroid or transforaminal injections recently  Discussed that I will let his primary care physician know and update Dr. Earlene Plater in regards to the lack of response to these conservative therapies  I did advise physical exercise and the importance of core strengthening and support so that over time things do not worsen with his lower spine condition  I also talked to the patient but the importance of cognitive behavioral and psych therapy to help manage his underlying anxiety  I did offer referral to psychiatry but patient prefers to follow-up with his primary care to get a referral closer to home  Discussed the connection between mood disorders and pain which can cycle and cause flares that contribute to each other.  I think would be beneficial for him to get some coping strategies and management strategies under his belt and optimize his anxiety management.  If he continues to not be a good candidate for surgical treatment and continues to fail conservative treatments we may consider alternative interventional therapy such as stimulator implant versus other minimally invasive treatments.  We will discuss this with Dr. Welton Flakes.  Patient has been post surgery for less than 1 year

## 2020-03-12 ENCOUNTER — Encounter: Admit: 2020-03-12 | Discharge: 2020-03-12

## 2020-03-12 ENCOUNTER — Ambulatory Visit: Admit: 2020-03-12 | Discharge: 2020-03-12 | Payer: No Typology Code available for payment source

## 2020-03-12 DIAGNOSIS — M4800 Spinal stenosis, site unspecified: Secondary | ICD-10-CM

## 2020-03-12 DIAGNOSIS — M48062 Spinal stenosis, lumbar region with neurogenic claudication: Secondary | ICD-10-CM

## 2020-03-12 DIAGNOSIS — T7840XA Allergy, unspecified, initial encounter: Secondary | ICD-10-CM

## 2020-03-12 DIAGNOSIS — M5136 Other intervertebral disc degeneration, lumbar region: Secondary | ICD-10-CM

## 2020-03-12 DIAGNOSIS — M4804 Spinal stenosis, thoracic region: Secondary | ICD-10-CM

## 2020-03-12 DIAGNOSIS — F419 Anxiety disorder, unspecified: Secondary | ICD-10-CM

## 2020-03-12 DIAGNOSIS — M1990 Unspecified osteoarthritis, unspecified site: Secondary | ICD-10-CM

## 2020-03-12 DIAGNOSIS — M2550 Pain in unspecified joint: Secondary | ICD-10-CM

## 2020-03-12 NOTE — Progress Notes
SPINE CENTER CLINIC NOTE       SUBJECTIVE:   Troy Matthews is a 44 year old who previously underwent an MIS L3 right hemilaminectomy with Dr. Earlene Plater in February 2020.  The patient had good improvement of his symptoms.  In the interim the patient has developed progressive lower extremity weakness and bilateral leg pain left worse than right.  Patient reports he is previously independently ambulatory but now since December 2020 has required a cane for ambulation.  Patient reports intermittent periods where his lower extremities give out and he falls.  Patient does report numerous falls of the last couple months despite using a cane for ambulation.  Patient reports urinary urgency without urinary or fecal incontinence.  Patient feels unsteady with ambulation.  The patient underwent a CT of the lumbar spine without contrast at [facility] in December 2020 which reported to show central spinal stenosis at T10-11 and at L3-4.  The patient also had a remark of bilateral neuroforaminal stenosis at L3-4.  Patient does not have any more recent MRI imaging.       Review of Systems    Current Outpatient Medications:   ?  cyclobenzaprine (FLEXERIL) 10 mg tablet, Take 10 mg by mouth at bedtime daily., Disp: , Rfl:   ?  diazePAM (VALIUM) 5 mg tablet, Take 5 mg by mouth as Needed for Anxiety., Disp: , Rfl:   ?  duloxetine DR (CYMBALTA) 30 mg capsule, Take 30 mg by mouth daily., Disp: , Rfl:   ?  ETODOLAC PO, Take 400 mg by mouth twice daily as needed., Disp: , Rfl:   ?  gabapentin (NEURONTIN) 600 mg tablet, Take one tablet by mouth every 8 hours. Indications: neuropathic pain, Disp: 90 tablet, Rfl: 3  ?  metoprolol XL (TOPROL XL) 25 mg extended release tablet, , Disp: , Rfl:   ?  oxyCODONE (ROXICODONE) 15 mg tablet, Take 15 mg by mouth three times daily, Disp: , Rfl:   ?  pantoprazole DR (PROTONIX) 40 mg tablet, Take 40 mg by mouth daily as needed., Disp: , Rfl:   No Known Allergies  Physical Exam  Vitals:    03/12/20 1054   BP: (!) 124/91   Pulse: 79   Temp: 36.6 ?C (97.9 ?F)   SpO2: 97%   Weight: 100.2 kg (221 lb)   Height: 174 cm (68.5)   PainSc: Seven        Pain Score: Seven  Body mass index is 33.11 kg/m?Marland Kitchen    General Appearance: No acute distress  Lungs: No audible wheezes  Heart: Regular rate and rhythm  Abdomen: Soft, non-tender  Extremities: No edema, redness or tenderness in the calves or thighs  Psychiatric: Normal affect  Integumentary: Warm, no rashes    Neurologic Exam:   Mental Status: Awake, alert and oriented x 4, fluent speech, normal cognition  Pupils: Pupils equal round and reactive to light  Cranial Nerves: CN II-XII individually tested and found to be intact, gag not tested  Motor:  UE: 5/5 SA 5/5 EF/EE 5/5 Grip  LE: 4/5 HF 4/5 KF/KE 4/5 DF/PF  Normal muscle bulk and tone  No pronator drift  Sensation: Sensation diminished L > R bilateral lower extremities  Deep Tendon Reflexes:  UE: +2 B/BR  LE: +3 P/A  2-3 beats clonus bilaterally  Gait: Wide based cain assisted gait  Cerebellar: No dysmetria    Radiology and other Diagnostics Review:    CT lumbar spine as noted in HPI    Assessment and Plan  Troy Matthews is a 44 year old male who presents to clinic to address persistent low back pain.  Since the patient was seen and last had surgery with Dr. Earlene Plater he has had a notable stable decline in his ambulatory function.  Patient has had progressive lower extremity weakness, sensory changes, urinary urgency, and development of symptoms concerning for myelopathy.  Patient's radiographic work-up thus far is an adequate for the patient's presenting symptoms.  We will send the patient for an urgent MRI of the thoracic and lumbar spine and see the patient back when these images are done.    Patient seen with Dr. Earlene Plater who formulated the above plan    [place]k, MD    I personally supervised the resident performing the E/M, I examined the patient, discussed case with resident and patient, and concur with resident documentation of history, physical assessment and treatment plan unless otherwise noted.

## 2020-03-21 ENCOUNTER — Encounter: Admit: 2020-03-21 | Discharge: 2020-03-21

## 2020-03-21 NOTE — Telephone Encounter
Pt has cancelled his lumbar MRI because he no longer carries insurance and cannot afford out of pocket

## 2020-07-16 ENCOUNTER — Encounter: Admit: 2020-07-16 | Discharge: 2020-07-16

## 2020-07-16 NOTE — Telephone Encounter
Patient's wife LVM wanting to schedule FUV.  RN returned call and LVM inviting a return call to schedule that f/u appt.

## 2020-07-30 ENCOUNTER — Encounter: Admit: 2020-07-30 | Discharge: 2020-07-30

## 2020-08-13 ENCOUNTER — Ambulatory Visit: Admit: 2020-08-13 | Discharge: 2020-08-13 | Payer: BC Managed Care – HMO

## 2020-08-13 ENCOUNTER — Encounter: Admit: 2020-08-13 | Discharge: 2020-08-13 | Payer: BC Managed Care – HMO

## 2020-08-13 DIAGNOSIS — M2550 Pain in unspecified joint: Secondary | ICD-10-CM

## 2020-08-13 DIAGNOSIS — M4804 Spinal stenosis, thoracic region: Secondary | ICD-10-CM

## 2020-08-13 DIAGNOSIS — F419 Anxiety disorder, unspecified: Secondary | ICD-10-CM

## 2020-08-13 DIAGNOSIS — M1990 Unspecified osteoarthritis, unspecified site: Secondary | ICD-10-CM

## 2020-08-13 DIAGNOSIS — T7840XA Allergy, unspecified, initial encounter: Secondary | ICD-10-CM

## 2020-08-13 DIAGNOSIS — M5136 Other intervertebral disc degeneration, lumbar region: Secondary | ICD-10-CM

## 2020-08-13 DIAGNOSIS — M4800 Spinal stenosis, site unspecified: Secondary | ICD-10-CM

## 2020-08-13 NOTE — Progress Notes
Subjective:       History of Present Illness  Troy Matthews is a 44 y.o. male.  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  Unfortunately he is only obtained in new MRI of the lumbar spine since he last saw me and has not underwent an MRI of the thoracic spine.  He continues to have occultly with ambulation secondary to trouble with his balance.  This is continuing to get slightly worse.  He feels like his legs are weak when he tries to walk.             Objective:         ? cyclobenzaprine (FLEXERIL) 10 mg tablet Take 10 mg by mouth at bedtime daily.   ? diazePAM (VALIUM) 5 mg tablet Take 5 mg by mouth as Needed for Anxiety.   ? duloxetine DR (CYMBALTA) 30 mg capsule Take 30 mg by mouth three times daily. Two in the morning, and one at night   ? ETODOLAC PO Take 400 mg by mouth twice daily as needed.   ? gabapentin (NEURONTIN) 600 mg tablet Take one tablet by mouth every 8 hours. Indications: neuropathic pain   ? metoprolol XL (TOPROL XL) 25 mg extended release tablet    ? oxyCODONE (ROXICODONE) 15 mg tablet Take 15 mg by mouth three times daily   ? pantoprazole DR (PROTONIX) 40 mg tablet Take 40 mg by mouth daily as needed.     Vitals:    08/13/20 1149   BP: 131/86   BP Source: Arm, Left Upper   Patient Position: Sitting   Pulse: 98   Temp: 36.6 ?C (97.8 ?F)   TempSrc: Oral   SpO2: 99%   Weight: 100.2 kg (221 lb)   Height: 174 cm (68.5)   PainSc: Ten     Body mass index is 33.11 kg/m?Marland Kitchen     Physical Exam  Vitals and nursing note reviewed.   Constitutional:       Appearance: Normal appearance.   HENT:      Head: Normocephalic and atraumatic.   Pulmonary:      Effort: Pulmonary effort is normal.   Neurological:      General: No focal deficit present.      Mental Status: He is alert and oriented to person, place, and time.      Sensory: Sensation is intact.      Motor: Motor function is intact.      Gait: Gait abnormal (Wide-based ataxic gait).      Deep Tendon Reflexes:      Reflex Scores:       Tricep reflexes are 2+ on the right side and 2+ on the left side.       Bicep reflexes are 2+ on the right side and 2+ on the left side.       Patellar reflexes are 3+ on the right side and 3+ on the left side.     Comments: Bilateral ankle clonus 3-4 beats   Psychiatric:         Mood and Affect: Mood normal.         Behavior: Behavior normal.         Thought Content: Thought content normal.         Judgment: Judgment normal.     I independently reviewed MRI of the lumbar spine which shows good decompression of the prior operated level of L3-4.  The lumbar spine there is no significant stenosis  or nerve root impingement.         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  Once have been able to view these images we will contact him with further recommendations.

## 2020-08-13 NOTE — Patient Instructions
It was nice to see you today.  Thank you for choosing to visit our clinic.  Your time is important, and if you had to wait today, we do apologize.  Our goal is to run exactly on time.  However, on occasion, we get behind in clinic due to unexpected patient issues.  Thank you for your patience.    General Instructions:  ? Scheduling:  Our scheduling phone number is [phone].  ? Appointment Reminders on your cell phone:  Make sure we have your cell phone number in your account, and text [place] to [phone].  ? How to reach our office:  Please send a MyChart message to the Spine Center or leave a voicemail for the nurse, Elonda Husky, at [phone].  ? How to get a medication refill:  Please use the MyChart Refill request or contact your pharmacy directly to request medication refills.  Please allow 72 business hours for request to be completed.    ? Support for many chronic illnesses is available through [employer] at [email or web address] or [phone].    ? For help with MyChart:  please call [phone].    ? For questions on nights, weekends or holidays:  call the Operator at [phone], and ask for the doctor on call for Neurosurgery.    ? For more information on spinal conditions:  please visit [email or web address]     Again, thank you for coming in today.

## 2020-09-27 ENCOUNTER — Encounter: Admit: 2020-09-27 | Discharge: 2020-09-27 | Payer: BC Managed Care – HMO

## 2020-09-27 ENCOUNTER — Ambulatory Visit: Admit: 2020-09-27 | Discharge: 2020-09-27 | Payer: BC Managed Care – HMO

## 2020-09-27 DIAGNOSIS — M4804 Spinal stenosis, thoracic region: Secondary | ICD-10-CM

## 2020-10-02 ENCOUNTER — Encounter: Admit: 2020-10-02 | Discharge: 2020-10-02 | Payer: BC Managed Care – HMO

## 2020-10-06 ENCOUNTER — Ambulatory Visit: Admit: 2020-10-06 | Discharge: 2020-10-07 | Payer: BC Managed Care – HMO

## 2020-10-06 ENCOUNTER — Encounter: Admit: 2020-10-06 | Discharge: 2020-10-06 | Payer: BC Managed Care – HMO

## 2020-10-06 DIAGNOSIS — M4800 Spinal stenosis, site unspecified: Secondary | ICD-10-CM

## 2020-10-06 DIAGNOSIS — M1990 Unspecified osteoarthritis, unspecified site: Secondary | ICD-10-CM

## 2020-10-06 DIAGNOSIS — M5136 Other intervertebral disc degeneration, lumbar region: Secondary | ICD-10-CM

## 2020-10-06 DIAGNOSIS — M4804 Spinal stenosis, thoracic region: Secondary | ICD-10-CM

## 2020-10-06 DIAGNOSIS — F419 Anxiety disorder, unspecified: Secondary | ICD-10-CM

## 2020-10-06 DIAGNOSIS — M2550 Pain in unspecified joint: Secondary | ICD-10-CM

## 2020-10-06 DIAGNOSIS — T7840XA Allergy, unspecified, initial encounter: Secondary | ICD-10-CM

## 2020-10-06 NOTE — Progress Notes
Todays visit took place via face-to-face encounter utilizing Zoom application.      Subjective:       History of Present Illness  Troy Matthews is a 44 y.o. male.  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  I independently reviewed this MRI of the thoracic spine which demonstrates calcified lesion, likely ligament, projecting into the canal and impinging upon the spinal cord at the T10-11 level on the left.  There may be some spinal cord signal change at this level as well.  Patient continues to have myelopathic symptoms such as decreased sensation from the waist down and difficulty with balance when he walks.  Symptoms are worse with standing/walking and improved with recumbency.             Objective:         ? busPIRone (BUSPAR) 10 mg tablet TAKE ONE TABLET BY MOUTH TWICE DAILY FOR ANXIETY   ? cyclobenzaprine (FLEXERIL) 10 mg tablet Take 10 mg by mouth at bedtime daily.   ? diazePAM (VALIUM) 5 mg tablet Take 5 mg by mouth as Needed for Anxiety.   ? duloxetine DR (CYMBALTA) 30 mg capsule Take 30 mg by mouth three times daily. Two in the morning, and one at night   ? ETODOLAC PO Take 400 mg by mouth twice daily as needed.   ? gabapentin (NEURONTIN) 600 mg tablet Take one tablet by mouth every 8 hours. Indications: neuropathic pain   ? metoprolol XL (TOPROL XL) 25 mg extended release tablet    ? oxyCODONE (ROXICODONE) 15 mg tablet Take 15 mg by mouth three times daily   ? pantoprazole DR (PROTONIX) 40 mg tablet Take 40 mg by mouth daily as needed.     There were no vitals filed for this visit.  There is no height or weight on file to calculate BMI.     Physical Exam  Constitutional:       Appearance: Normal appearance.   HENT:      Head: Normocephalic and atraumatic.   Pulmonary:      Effort: Pulmonary effort is normal.   Neurological:      Mental Status: He is alert and oriented to person, place, and time.   Psychiatric:         Mood and Affect: Mood normal. Behavior: Behavior normal.              Assessment and Plan:     I am recommending T10-11 laminectomy with instrumented fusion.  I believe I will have to remove significant amount of bone in order to properly decompress his spinal cord and this will lead to instability at the T10-11 level, necessitating an instrumented fusion at this level.  Surgical procedure, risk, benefits, alternatives were discussed with the patient in detail.

## 2020-10-06 NOTE — Patient Instructions
It was nice to see you today.  Thank you for choosing to visit our clinic.  Your time is important, and if you had to wait today, we do apologize.  Our goal is to run exactly on time.  However, on occasion, we get behind in clinic due to unexpected patient issues.  Thank you for your patience.    General Instructions:   Scheduling:  Our scheduling phone number is [phone].   Appointment Reminders on your cell phone:  Make sure we have your cell phone number in your account, and text [place] to [phone].   How to reach our office:  Please send a MyChart message to the Spine Center or leave a voicemail for the nurse, Blease Capaldi, at [phone].   How to get a medication refill:  Please use the MyChart Refill request or contact your pharmacy directly to request medication refills.  Please allow 72 business hours for request to be completed.     Support for many chronic illnesses is available through [employer] at [email or web address] or [phone].     For help with MyChart:  please call [phone].     For questions on nights, weekends or holidays:  call the Operator at [phone], and ask for the doctor on call for Neurosurgery.     For more information on spinal conditions:  please visit [email or web address]     Again, thank you for coming in today.

## 2020-11-21 ENCOUNTER — Encounter: Admit: 2020-11-21 | Discharge: 2020-11-21 | Payer: BC Managed Care – HMO

## 2020-11-21 ENCOUNTER — Inpatient Hospital Stay: Admit: 2020-11-21 | Discharge: 2020-11-21 | Payer: BC Managed Care – HMO

## 2020-11-21 DIAGNOSIS — M4804 Spinal stenosis, thoracic region: Secondary | ICD-10-CM

## 2020-11-21 NOTE — Telephone Encounter
Patient calling to schedule T10/11 laminectomy with instrumented fusion with Dr. Earlene Plater.  Selected tentative surgery date of 02/02/2021.  RN reviewed pre/post op instructions.  Patient does not have current insurance card on file.  He states he will try to upload images of it to MyChart this weekend.  If unsuccessful, he will contact office on 01/22/21.  Patient demonstrates understanding and is agreeable to plan.

## 2020-12-03 NOTE — Telephone Encounter
Patient is scheduled for surgery on 02/03/2021.  Patient LVM stating he would like to move his surgery date.  RN called and spoke with patient's wife.  Patient was not home.  Wife states he would liek to discuss moving the surgical date.  RN asked for patient to return a call to our office so we can discuss the patient's options.  Patient's wife demonstrates understanding and is agreeable to plan.

## 2020-12-05 NOTE — Telephone Encounter
Patient is scheduled for surgery on 02/03/2021.  Patient calling with questions about post-op needs.  RN addressed activity restrictions and physical requirements that must be met to be discharged home.  Patient would like to see if we could move his surgery up to 01/27/2021, but there is no opening at this time.  RN offered other future dates, but patient decided to keep surgery scheduled for 02/03/2021.  If we do get an opening, this RN will call patient and discuss.  Patient demonstrates understanding and is agreeable to plan.

## 2020-12-18 ENCOUNTER — Encounter: Admit: 2020-12-18 | Discharge: 2020-12-18 | Payer: BC Managed Care – HMO

## 2020-12-18 NOTE — Telephone Encounter
Called patients phone first and got a fast busy signal. Then called wife's phone and L/M requesting c/b from patient to schedule PAT for upcoming surgery with Dr. Earlene Plater on 04.19.2022.

## 2020-12-19 ENCOUNTER — Encounter: Admit: 2020-12-19 | Discharge: 2020-12-19 | Payer: BC Managed Care – HMO

## 2021-01-22 ENCOUNTER — Encounter: Admit: 2021-01-22 | Discharge: 2021-01-22 | Payer: BC Managed Care – HMO

## 2021-01-22 NOTE — Telephone Encounter
Patient missed PAT appointment on 04.06.2022 for upcoming surgery with Dr. Earlene Plater on 04.19.2022. L/M requesting cb to discuss rescheduling PAT appointment.

## 2021-01-28 ENCOUNTER — Ambulatory Visit: Admit: 2021-01-28 | Discharge: 2021-01-28 | Payer: BC Managed Care – HMO

## 2021-01-28 ENCOUNTER — Encounter: Admit: 2021-01-28 | Discharge: 2021-01-28 | Payer: BC Managed Care – HMO

## 2021-01-28 DIAGNOSIS — M4800 Spinal stenosis, site unspecified: Secondary | ICD-10-CM

## 2021-01-28 DIAGNOSIS — M2550 Pain in unspecified joint: Secondary | ICD-10-CM

## 2021-01-28 DIAGNOSIS — R000 Tachycardia, unspecified: Secondary | ICD-10-CM

## 2021-01-28 DIAGNOSIS — Z01810 Encounter for preprocedural cardiovascular examination: Secondary | ICD-10-CM

## 2021-01-28 DIAGNOSIS — M48062 Spinal stenosis, lumbar region with neurogenic claudication: Secondary | ICD-10-CM

## 2021-01-28 DIAGNOSIS — I10 Essential (primary) hypertension: Secondary | ICD-10-CM

## 2021-01-28 DIAGNOSIS — F419 Anxiety disorder, unspecified: Secondary | ICD-10-CM

## 2021-01-28 DIAGNOSIS — M5136 Other intervertebral disc degeneration, lumbar region: Secondary | ICD-10-CM

## 2021-01-28 DIAGNOSIS — T7840XA Allergy, unspecified, initial encounter: Secondary | ICD-10-CM

## 2021-01-28 DIAGNOSIS — M1990 Unspecified osteoarthritis, unspecified site: Secondary | ICD-10-CM

## 2021-01-28 LAB — COMPREHENSIVE METABOLIC PANEL
Lab: 0.4 mg/dL — ABNORMAL LOW (ref 0.3–1.2)
Lab: 1 mg/dL (ref 0.4–1.24)
Lab: 108 MMOL/L (ref 98–110)
Lab: 11 (ref 3–12)
Lab: 110 mg/dL — ABNORMAL HIGH (ref 70–100)
Lab: 111 U/L — ABNORMAL HIGH (ref 25–110)
Lab: 142 MMOL/L (ref 137–147)
Lab: 18 mg/dL (ref 7–25)
Lab: 23 MMOL/L (ref 21–30)
Lab: 23 U/L (ref 7–40)
Lab: 28 U/L (ref 7–56)
Lab: 4.1 MMOL/L (ref 3.5–5.1)
Lab: 4.5 g/dL (ref 3.5–5.0)
Lab: 7.3 g/dL (ref 6.0–8.0)
Lab: 9.3 mg/dL (ref 8.5–10.6)
Lab: >60 mL/min (ref >60)

## 2021-01-28 LAB — CBC: Lab: 11 K/UL — ABNORMAL HIGH (ref 4.5–11.0)

## 2021-01-28 NOTE — Pre-Anesthesia Patient Instructions
GENERAL INFORMATION    Before you come to the hospital  If you are having outpatient surgery, make arrangements for a responsible adult to drive you home and stay with you for 24 hours following surgery.  Bath/Shower Instructions  Take a bath or shower with antibacterial soap you received in the [facility] as directed. Use clean towels.  Put on clean clothes after bath or shower.  Avoid using lotion and oils.  If you are having surgery above the waist, wear a shirt that fastens up the front.  Sleep on clean sheets if bath or shower is done the night before procedure.  Leave money, credit cards, jewelry, and any other valuables at home. The [facility] is not responsible for the loss or breakage of personal items.  Remove nail polish, makeup and all jewelry (including piercings) before coming to the hospital.  The morning of your procedure:  brush your teeth and tongue  do not smoke  do not shave the area where you will have surgery    What to bring to the hospital  ID/ Insurance Card  Medical Device card  Official documents for legal guardianship   Copy of your Living Will, Advanced Directives, and/or Durable Power of Attorney   Small bag with a few personal belongings  CPAP/BiPAP machine (including all supplies)  Walker,cane, or motorized scooter  Cases for glasses/hearing aids/contact lens (bring solutions for contacts)  Dress in clean, loose, comfortable clothing     Eating or drinking before surgery  Do not eat or drink anything after 11:00 p.m. the day before your procedure (including gum, mints, candy, or chewing tobacco) OR follow the specific instructions you were given by your Surgeon.  You may have WATER ONLY up to 2 hours before arriving at the hospital.     Other instructions  Notify your surgeon if:  you become ill with a cough, fever, sore throat, nausea, vomiting or flu-like symptoms  you develop a rash or have any open wounds/sores that are red, painful, draining, or are new since you last saw  the doctor  you need to cancel your procedure  You will receive a call with your surgery arrival time from between 2:30pm and 4:30pm the last business day before your procedure.  If you do not receive a call, please call [phone] before 4:30pm or [phone] after 4:30pm.  Phone carriers that use spam blockers will sometimes block our phone numbers. If your phone contact number is a mobile phone, please adjust your settings to make sure you receive our call.  In your phone settings, turn OFF the setting ?silence unknown callers.?  Please add these phone numbers to your contacts [phone] & [phone]    Notify us at [place]: [phone]  if you need to cancel your procedure  if you are going to be late    Arrival at the hospital  [facility]  [address]  [place], [place] [address]    Park in the P5 parking garage located at [address] [place], [place] [address].   Valet parking is available in front of [employer] [facility] between the hours of 7:00 am and 4:00 pm Monday through Friday.  If parking in the P5 garage, take the east elevators in the parking garage to the second level and walk to the entrance of the [employer].    Enter through the 1st floor main entrance and check in with Information Desk.  If you are having an outpatient procedure, you will need to arrange for a responsible ride/person to accompany you home due to sedation or anesthesia with your procedure. A responsible person is a person who has the ability to identify a change in the patient's status and notify medical personnel.  This is typically a family member or friend.  Public transportation is permitted if you have a responsible person to accompany you.  An Benedetto Goad, taxi or other public transportation driver is not considered a responsible person to accompany you home.    Coronavirus (COVID19) Information  If you get sick with fever (100.109F/38C or higher), cough, or have trouble breathing:  Call your primary care physician for questions or health needs.  Tell your doctor about any recent travel and your symptoms.  Check your MyChart for your COVID swab results. (MyChart notifications are immediate and patients are often know their test result before their surgeon is notified).  Notify your surgeon if you are COVID+ positive.  If you are COVID+ positive DO NOT come to the hospital for your surgery until your surgeon has instructed you on what to do. Wait for instructions to find out if you should stay home or if you should still have surgery.  Avoid contact with others.    For up to date information on the Coronavirus, visit the CDC website at DiningCalendar.de.      For the safety of all patients, visitors and staff as we work to contain COVID-19, we must restrict patient visitors.    Current Visitor Policy (12/24/20):    Our current, and ongoing, visitor rules in surgery and procedural areas are:    1 visitor per patient will be allowed to accompany the patient and wait in the Waiting Room  No visitors will be allowed into the pre/post areas     Patients in inpatient and pediatric units, Emergency Department, ambulatory clinics and lab appointments may only have two visitors.     For inpatient stays, patients may have 2 visitors at a time at their bedside. The two visitors can change throughout the day, but no more than two at a time may be bedside.  The policy applies to [facility] [place], [address], [place] and [place] campuses and clinics.    Exceptions include:  No visitors allowed for patients with active COVID-19 infections.  Children younger than age 42 are once again allowed to visit inpatients.  Parents or guardians may bring their children to their clinic appointments or bring siblings to their children's clinic appointment.  One visitor allowed during a patient?s cancer exam appointment; however, no visitors allowed during their cancer treatment/infusion appointment; check with staff for exceptions.  One visitor allowed in perioperative and procedural waiting rooms; no visitors allowed in pre/post areas unless a patient becomes an overnight boarder.  Two parents/guardians are allowed for surgical or procedural patients younger than 44 years old.  Adult inpatients in semiprivate rooms may have visitors, but visits should be coordinated so only two total visitors are in a room at a time due to space limitations.    Visitors must be free of fever and symptoms to be in our facilities. We ask visitors to follow these guidelines:  Wear a mask at all times, unless under the age of 2, have trouble breathing or are unconscious, incapacitated or otherwise unable to remove the cover without assistance.  Go directly to the nursing station in the unit you are visiting and do  not linger in public areas.  Check in at the nursing station before going to the patient's room.  Maintain a physical distance of six feet from all others.  Follow elevator restrictions to four riding at a time - peak times are 6:30-7:30 a.m., noon and 6:30-7:30 p.m.  Be aware cafeteria peak times are 11 a.m. - 1 p.m.  Wash your hands frequently and cover your coughs and sneezes.

## 2021-02-02 ENCOUNTER — Encounter: Admit: 2021-02-02 | Discharge: 2021-02-02 | Payer: BC Managed Care – HMO

## 2021-02-02 NOTE — Telephone Encounter
Called patient to cancel his surgery with Dr. Earlene Plater on 04.19.2022 because Dr. Earlene Plater has Covid. Canceled surgery with surgery scheduling and canceled both post op appointments. Let patient's wife know Larina Bras will be calling to reschedule surgery.

## 2021-02-04 ENCOUNTER — Encounter: Admit: 2021-02-04 | Discharge: 2021-02-04 | Payer: BC Managed Care – HMO

## 2021-02-10 ENCOUNTER — Encounter: Admit: 2021-02-10 | Discharge: 2021-02-10 | Payer: BC Managed Care – HMO

## 2021-02-10 NOTE — Telephone Encounter
Spoke with Elonda Husky, patient's wife. Patient would like to move his surgery date back to 04.28.2022. I explained we have filled all the surgery spots for Dr. Earlene Plater on that day. I told patient if anyone canceled or could not do their surgery that is scheduled on 05.10.2022 we would call her.

## 2021-02-17 ENCOUNTER — Encounter: Admit: 2021-02-17 | Discharge: 2021-02-17 | Payer: BC Managed Care – HMO

## 2021-02-17 ENCOUNTER — Emergency Department
Admit: 2021-02-17 | Discharge: 2021-02-17 | Disposition: A | Discharge status: Home / Self Care | Payer: BC Managed Care – HMO

## 2021-02-17 DIAGNOSIS — M4800 Spinal stenosis, site unspecified: Secondary | ICD-10-CM

## 2021-02-17 DIAGNOSIS — F419 Anxiety disorder, unspecified: Secondary | ICD-10-CM

## 2021-02-17 DIAGNOSIS — M5136 Other intervertebral disc degeneration, lumbar region: Secondary | ICD-10-CM

## 2021-02-17 DIAGNOSIS — I10 Essential (primary) hypertension: Secondary | ICD-10-CM

## 2021-02-17 DIAGNOSIS — M545 Low back pain, unspecified back pain laterality, unspecified chronicity, unspecified whether sciatica present: Secondary | ICD-10-CM

## 2021-02-17 DIAGNOSIS — M1990 Unspecified osteoarthritis, unspecified site: Secondary | ICD-10-CM

## 2021-02-17 DIAGNOSIS — M2550 Pain in unspecified joint: Secondary | ICD-10-CM

## 2021-02-17 DIAGNOSIS — T7840XA Allergy, unspecified, initial encounter: Secondary | ICD-10-CM

## 2021-02-17 DIAGNOSIS — R000 Tachycardia, unspecified: Secondary | ICD-10-CM

## 2021-02-17 LAB — CBC AND DIFF
ABSOLUTE BASO COUNT: 0 K/UL (ref 0–0.20)
ABSOLUTE EOS COUNT: 0.2 K/UL (ref 0–0.45)
ABSOLUTE LYMPH COUNT: 4.1 K/UL (ref >60)
ABSOLUTE MONO COUNT: 0.7 K/UL (ref 0–0.80)
ABSOLUTE NEUTROPHIL: 5.4 K/UL (ref 1.8–7.0)
LYMPHOCYTES %: 39 % (ref 24–44)
MDW (MONOCYTE DISTRIBUTION WIDTH): 17 (ref <20.7)
RBC COUNT: 5 M/UL (ref 4.4–5.5)
WBC COUNT: 10 K/UL (ref 4.5–11.0)

## 2021-02-17 LAB — COMPREHENSIVE METABOLIC PANEL
ALBUMIN: 4.3 g/dL (ref 3.5–5.0)
ALT: 40 U/L (ref 7–56)
AST: 27 U/L (ref 7–40)
BLD UREA NITROGEN: 12 mg/dL (ref 7–25)
CALCIUM: 8.9 mg/dL (ref 8.5–10.6)
CHLORIDE: 104 MMOL/L — ABNORMAL HIGH (ref 98–110)
CO2: 24 MMOL/L (ref 21–30)
CREATININE: 0.9 mg/dL (ref 0.4–1.24)
GLUCOSE,PANEL: 79 mg/dL (ref 70–100)
POTASSIUM: 3.8 MMOL/L (ref 3.5–5.1)
TOTAL BILIRUBIN: 0.4 mg/dL — ABNORMAL LOW (ref 0.3–1.2)
TOTAL PROTEIN: 7.1 g/dL (ref 6.0–8.0)

## 2021-02-17 MED ORDER — KETOROLAC 15 MG/ML IJ SOLN
15 mg | Freq: Once | INTRAVENOUS | 0 refills | Status: CP
Start: 2021-02-17 — End: ?
  Administered 2021-02-17: 19:00:00 15 mg via INTRAVENOUS

## 2021-02-17 MED ORDER — METHOCARBAMOL 500 MG PO TAB
500 mg | Freq: Once | ORAL | 0 refills | Status: CP
Start: 2021-02-17 — End: ?
  Administered 2021-02-17: 19:00:00 500 mg via ORAL

## 2021-02-17 MED ORDER — ACETAMINOPHEN 500 MG PO TAB
1000 mg | Freq: Once | ORAL | 0 refills | Status: CP
Start: 2021-02-17 — End: ?
  Administered 2021-02-17: 19:00:00 1000 mg via ORAL

## 2021-02-17 MED ORDER — LIDOCAINE 5 % TP PTMD
1 | Freq: Once | TOPICAL | 0 refills | Status: DC
Start: 2021-02-17 — End: 2021-02-18
  Administered 2021-02-17: 19:00:00 1 via TOPICAL

## 2021-02-17 MED ORDER — LIDOCAINE 5 % TP PTMD
1 | MEDICATED_PATCH | Freq: Every day | TOPICAL | 0 refills | 30.00000 days | Status: AC
Start: 2021-02-17 — End: ?

## 2021-02-17 MED ORDER — OXYCODONE 5 MG PO TAB
10 mg | ORAL_TABLET | ORAL | 0 refills | 6.00000 days | Status: AC | PRN
Start: 2021-02-17 — End: ?
  Filled 2021-03-12: qty 4, 2d supply, fill #1

## 2021-02-17 MED ORDER — MORPHINE 4 MG/ML IV SYRG
4 mg | Freq: Once | INTRAVENOUS | 0 refills | Status: CP
Start: 2021-02-17 — End: ?
  Administered 2021-02-17: 19:00:00 4 mg via INTRAVENOUS

## 2021-02-17 NOTE — ED Notes
Pt A&Ox4 resting in bed, respirations even and non-labored. Skin is warm, dry, intact and appropriate for ethnicity. Pt hooked up to monitors. Bed is in lowest locked position, call light within reach.

## 2021-02-17 NOTE — ED Notes
Pt A&Ox4 and given discharge instructions and follow up information. Pt verbalizes understanding and has no further questions or complaints. VSS. Pt ambulated out of ED w/ steady gait to go home by POV.

## 2021-02-18 ENCOUNTER — Encounter: Admit: 2021-02-18 | Discharge: 2021-02-18 | Payer: BC Managed Care – HMO

## 2021-02-20 NOTE — Patient Instructions
It was nice to see you today.  Thank you for choosing to visit our clinic.  Your time is important, and if you had to wait today, we do apologize.  Our goal is to run exactly on time.  However, on occasion, we get behind in clinic due to unexpected patient issues.  Thank you for your patience.    General Instructions:   Scheduling:  Our scheduling phone number is [phone].   Appointment Reminders on your cell phone:  Make sure we have your cell phone number in your account, and text [place] to [phone].   How to reach our office:  Please send a MyChart message to the Spine Center (directed to Dr. Davis) or leave a voicemail for the nurse, Danney Bungert, at [phone].   How to get a medication refill:  Please use the MyChart Refill request or contact your pharmacy directly to request medication refills.  Please allow 72 business hours for request to be completed.     Support for many chronic illnesses is available through [facility] at [email or web address] or [phone].     For help with MyChart:  please call [phone].     For questions on nights, weekends or holidays:  call the Operator at [phone], and ask for the doctor on call for Neurosurgery.     For more information on spinal conditions:  please visit [email or web address]     Again, thank you for coming in today.

## 2021-02-23 ENCOUNTER — Encounter: Admit: 2021-02-23 | Discharge: 2021-02-23 | Payer: BC Managed Care – HMO

## 2021-02-23 ENCOUNTER — Ambulatory Visit: Admit: 2021-02-23 | Discharge: 2021-02-23 | Payer: BC Managed Care – HMO

## 2021-02-23 DIAGNOSIS — T7840XA Allergy, unspecified, initial encounter: Secondary | ICD-10-CM

## 2021-02-23 DIAGNOSIS — M1990 Unspecified osteoarthritis, unspecified site: Secondary | ICD-10-CM

## 2021-02-23 DIAGNOSIS — I10 Essential (primary) hypertension: Secondary | ICD-10-CM

## 2021-02-23 DIAGNOSIS — M4804 Spinal stenosis, thoracic region: Secondary | ICD-10-CM

## 2021-02-23 DIAGNOSIS — M2550 Pain in unspecified joint: Secondary | ICD-10-CM

## 2021-02-23 DIAGNOSIS — M4800 Spinal stenosis, site unspecified: Secondary | ICD-10-CM

## 2021-02-23 DIAGNOSIS — M5136 Other intervertebral disc degeneration, lumbar region: Secondary | ICD-10-CM

## 2021-02-23 DIAGNOSIS — R000 Tachycardia, unspecified: Secondary | ICD-10-CM

## 2021-02-23 DIAGNOSIS — F419 Anxiety disorder, unspecified: Secondary | ICD-10-CM

## 2021-02-23 NOTE — Progress Notes
[facility] Return Patient Note  Date of Visit: 02/23/21      Subjective:  HPI:  Troy Matthews is a 45 y.o. year old male who presents to spine clinic today with to discuss surgery and a recent ED visit for exacerbation of pain.  Patient reports that he had an exacerbation of pain with associated bowel/bladder incontinence and worsening diminished sensation in his lower extremities bilaterally.  The patient was ultimately evaluated in the emergency department and released with pain medication.  He feels like his symptoms were likely just an exacerbation of pain and he feels back to his usual state.  The patient had his surgery rescheduled due to external circumstances and now is planned for the 24th of this month.  The patient wanted to discuss postoperative care including the need for possible in-home rehab therapies.    ROS: Full review of systems negative except for as noted in the HPI.    Medical History:   Diagnosis Date   ? Allergies     Environmental, not current   ? Anxiety    ? Arthritis     Back   ? 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 Hypertension    ? Joint pain Have had for long time   ? Spinal stenosis 04/17/2018   ? Tachycardia        Surgical History:   Procedure Laterality Date   ? LAMINECTOMY  12/08/2018   ? MINIMALLY INVASIVE LUMBAR 3 LAMINECTOMY N/A 12/08/2018    Performed by Pablo Goshen, MD at [facility]   ? WISDOM TEETH EXTRACTION         Family History   Problem Relation Age of Onset   ? Osteoporosis Mother        Social History     Tobacco Use   ? Smoking status: Former Smoker     Packs/day: 0.00     Years: 5.00     Pack years: 0.00     Types: Cigars     Quit date: 2015     Years since quitting: 7.3   ? Smokeless tobacco: Never Used   ? Tobacco comment: cigars/cigarretts socially, not daily   Vaping Use   ? Vaping Use: Never used   Substance Use Topics   ? Alcohol use: Yes     Alcohol/week: 14.0 - 21.0 standard drinks     Types: 14 - 21 Cans of beer per week     Comment: daily   ? Drug use: Never       No Known Allergies    Current Outpatient Medications on File Prior to Visit   Medication Sig Dispense Refill   ? busPIRone (BUSPAR) 10 mg tablet Take 20 mg by mouth twice daily.     ? calcium carbonate (TUMS) 500 mg (200 mg elemental calcium) chewable tablet Chew 1 tablet by mouth daily as needed. Indications: heartburn     ? diazePAM (VALIUM) 5 mg tablet Take 5 mg by mouth daily as needed for Anxiety.     ? duloxetine DR (CYMBALTA) 30 mg capsule 60 mg. Take 2 capsules by mouth in the morning and 1 capsule at bedtime.     ? gabapentin (NEURONTIN) 300 mg capsule Take 600 mg by mouth three times daily.     ? lidocaine (LIDODERM) 5 % topical patch Apply one patch topically to affected area daily. Apply patch for 12 hours, then remove for 12 hours before repeating. 90 patch 0   ?  metoprolol XL (TOPROL XL) 25 mg extended release tablet Take 25 mg by mouth daily.     ? morphine (MSIR) 2 mg/mL oral solution Take 10 mg by mouth every 4 hours.     ? NARCAN 4 mg/actuation nasal spray ADMINISTER A SINGLE SPRAY INTRANASALLY INTO ONE NOSTRIL CALL 911 MAY REPEAT ONE TIME     ? OXcarbazepine (TRILEPTAL) 300 mg tablet take ONE-HALF tablet BY MOUTH EVERY MORNING AND ONE-HALF tablet AT NOON AND ONE tablet AT BEDTIME     ? oxyCODONE (ROXICODONE) 5 mg tablet Take two tablets by mouth every 8 hours as needed for Pain for up to 7 days. 20 tablet 0   ? oxyCODONE 10 mg tablet Take 10 mg by mouth three times daily.     ? sildenafiL (VIAGRA) 50 mg tablet TAKE ONE TABLET BY MOUTH ONE-HALF HOUR BEFORE SEX     ? vitamins, multiple tablet Take 1 tablet by mouth daily.       No current facility-administered medications on file prior to visit.       Objective:  BP (!) 137/97 (BP Source: Arm, Left Upper, Patient Position: Sitting)  - Pulse 100  - Temp 36.8 ?C (98.3 ?F)  - Ht 172.7 cm (5' 8)  - Wt 109.8 kg (242 lb)  - SpO2 100%  - BMI 36.80 kg/m?   Physical exam:  Awake, alert, oriented  Fluent speech, appropriate  Stable on room air  Warm and well perfused  Cranial nerves symmetric  Moves all extremities to command without focal deficit  Hyperreflexive lower extremities bilaterally  Cane assisted gait. Wide based. Right foot drop.  Normal mood and affect    Assessment/Plan:  Troy Matthews is a 45 y.o. 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  He is planning to undergo a T10-11 laminectomy and instrumented fusion on 03/10/2021.  We discussed this procedure including the aftercare in detail.  The patient recently received pain medications both from an ED visit and his primary care practitioner.  At this point his pain is stable and his questions regarding surgery have been answered.  We will see the patient back on the planned day of surgery.    I personally supervised the resident performing the E/M, I examined the patient, discussed case with resident and patient, and concur with resident documentation of history, physical assessment and treatment plan unless otherwise noted.

## 2021-03-10 ENCOUNTER — Encounter: Admit: 2021-03-10 | Discharge: 2021-03-10 | Payer: BC Managed Care – HMO

## 2021-03-10 ENCOUNTER — Inpatient Hospital Stay: Admit: 2021-03-10 | Discharge: 2021-03-10 | Payer: BC Managed Care – HMO

## 2021-03-10 DIAGNOSIS — R000 Tachycardia, unspecified: Secondary | ICD-10-CM

## 2021-03-10 DIAGNOSIS — T7840XA Allergy, unspecified, initial encounter: Secondary | ICD-10-CM

## 2021-03-10 DIAGNOSIS — F419 Anxiety disorder, unspecified: Secondary | ICD-10-CM

## 2021-03-10 DIAGNOSIS — I10 Essential (primary) hypertension: Secondary | ICD-10-CM

## 2021-03-10 DIAGNOSIS — M5136 Other intervertebral disc degeneration, lumbar region: Secondary | ICD-10-CM

## 2021-03-10 DIAGNOSIS — M1990 Unspecified osteoarthritis, unspecified site: Secondary | ICD-10-CM

## 2021-03-10 DIAGNOSIS — M2550 Pain in unspecified joint: Secondary | ICD-10-CM

## 2021-03-10 DIAGNOSIS — M4800 Spinal stenosis, site unspecified: Secondary | ICD-10-CM

## 2021-03-10 MED ORDER — ARTIFICIAL TEARS (PF) SINGLE DOSE DROPS GROUP
OPHTHALMIC | 0 refills | Status: DC
Start: 2021-03-10 — End: 2021-03-10
  Administered 2021-03-10: 13:00:00 2 [drp] via OPHTHALMIC

## 2021-03-10 MED ORDER — REMIFENTANYL 1000MCG IN NS 20ML (OR)
INTRAVENOUS | 0 refills | Status: DC
Start: 2021-03-10 — End: 2021-03-10
  Administered 2021-03-10 (×2): .06 ug/kg/min via INTRAVENOUS

## 2021-03-10 MED ORDER — PHENYLEPHRINE 40 MCG/ML IN NS IV DRIP (STD CONC)
INTRAVENOUS | 0 refills | Status: DC
Start: 2021-03-10 — End: 2021-03-10
  Administered 2021-03-10: 14:00:00 50 ug via INTRAVENOUS
  Administered 2021-03-10 (×2): .3 ug/kg/min via INTRAVENOUS
  Administered 2021-03-10: 14:00:00 50 ug via INTRAVENOUS

## 2021-03-10 MED ORDER — DEXAMETHASONE SODIUM PHOSPHATE 4 MG/ML IJ SOLN
INTRAVENOUS | 0 refills | Status: DC
Start: 2021-03-10 — End: 2021-03-10
  Administered 2021-03-10: 13:00:00 4 mg via INTRAVENOUS

## 2021-03-10 MED ORDER — ELECTROLYTE-A IV SOLP
INTRAVENOUS | 0 refills | Status: DC
Start: 2021-03-10 — End: 2021-03-10
  Administered 2021-03-10 (×2): via INTRAVENOUS

## 2021-03-10 MED ORDER — LIDOCAINE (PF) 200 MG/10 ML (2 %) IJ SYRG
INTRAVENOUS | 0 refills | Status: DC
Start: 2021-03-10 — End: 2021-03-10
  Administered 2021-03-10: 13:00:00 100 mg via INTRAVENOUS

## 2021-03-10 MED ORDER — ONDANSETRON HCL (PF) 4 MG/2 ML IJ SOLN
INTRAVENOUS | 0 refills | Status: DC
Start: 2021-03-10 — End: 2021-03-10
  Administered 2021-03-10: 16:00:00 4 mg via INTRAVENOUS

## 2021-03-10 MED ORDER — FENTANYL CITRATE (PF) 50 MCG/ML IJ SOLN
INTRAVENOUS | 0 refills | Status: DC
Start: 2021-03-10 — End: 2021-03-10
  Administered 2021-03-10: 13:00:00 100 ug via INTRAVENOUS

## 2021-03-10 MED ORDER — PROPOFOL 10 MG/ML IV EMUL 100 ML (INFUSION)(AM)(OR)
INTRAVENOUS | 0 refills | Status: DC
Start: 2021-03-10 — End: 2021-03-10
  Administered 2021-03-10: 13:00:00 150 ug/kg/min via INTRAVENOUS

## 2021-03-10 MED ORDER — ACETAMINOPHEN 1,000 MG/100 ML (10 MG/ML) IV SOLN
INTRAVENOUS | 0 refills | Status: DC
Start: 2021-03-10 — End: 2021-03-10
  Administered 2021-03-10: 15:00:00 1000 mg via INTRAVENOUS

## 2021-03-10 MED ORDER — HYDROMORPHONE (PF) 2 MG/ML IJ SYRG
INTRAVENOUS | 0 refills | Status: DC
Start: 2021-03-10 — End: 2021-03-10
  Administered 2021-03-10: 15:00:00 .2 mg via INTRAVENOUS
  Administered 2021-03-10: 16:00:00 .4 mg via INTRAVENOUS

## 2021-03-10 MED ORDER — CEFAZOLIN 1 GRAM IJ SOLR
INTRAVENOUS | 0 refills | Status: DC
Start: 2021-03-10 — End: 2021-03-10
  Administered 2021-03-10: 14:00:00 2 g via INTRAVENOUS

## 2021-03-10 MED ORDER — PROPOFOL INJ 10 MG/ML IV VIAL
INTRAVENOUS | 0 refills | Status: DC
Start: 2021-03-10 — End: 2021-03-10
  Administered 2021-03-10: 13:00:00 200 mg via INTRAVENOUS

## 2021-03-10 MED ORDER — ACETAMINOPHEN 1,000 MG/100 ML (10 MG/ML) IV SOLN
INTRAVENOUS | 0 refills | Status: DC
Start: 2021-03-10 — End: 2021-03-10

## 2021-03-10 MED ORDER — SUCCINYLCHOLINE CHLORIDE 20 MG/ML IJ SOLN
INTRAVENOUS | 0 refills | Status: DC
Start: 2021-03-10 — End: 2021-03-10
  Administered 2021-03-10: 13:00:00 120 mg via INTRAVENOUS

## 2021-03-10 MED ORDER — MIDAZOLAM 1 MG/ML IJ SOLN
INTRAVENOUS | 0 refills | Status: DC
Start: 2021-03-10 — End: 2021-03-10
  Administered 2021-03-10: 13:00:00 2 mg via INTRAVENOUS

## 2021-03-10 MED ORDER — PHENYLEPHRINE HCL IN 0.9% NACL 1 MG/10 ML (100 MCG/ML) IV SYRG
INTRAVENOUS | 0 refills | Status: DC
Start: 2021-03-10 — End: 2021-03-10
  Administered 2021-03-10 (×5): 100 ug via INTRAVENOUS

## 2021-03-10 MED ADMIN — BUPIVACAINE-EPINEPHRINE 0.5 %-1:200,000 IJ SOLN [14984]: 10 mL | INTRAMUSCULAR | @ 14:00:00 | Stop: 2021-03-10 | NDC 63323046301

## 2021-03-10 MED ADMIN — OXYCODONE 5 MG PO TAB [10814]: 20 mg | ORAL | @ 23:00:00 | NDC 00904696661

## 2021-03-10 MED ADMIN — DIAZEPAM 5 MG PO TAB [2405]: 5 mg | ORAL | @ 21:00:00 | NDC 51079028501

## 2021-03-10 MED ADMIN — CEFAZOLIN 1 GRAM IJ SOLR [1445]: 1000 mL | @ 14:00:00 | Stop: 2021-03-10 | NDC 00409080501

## 2021-03-10 MED ADMIN — METHOCARBAMOL 500 MG PO TAB [4971]: 1000 mg | ORAL | @ 23:00:00 | NDC 00904705761

## 2021-03-10 MED ADMIN — CEFAZOLIN INJ 1GM IVP [210319]: 2 g | INTRAVENOUS | @ 21:00:00 | Stop: 2021-03-11 | NDC 60505614200

## 2021-03-10 MED ADMIN — FENTANYL CITRATE (PF) 50 MCG/ML IJ SOLN [3037]: 50 ug | INTRAVENOUS | @ 16:00:00 | Stop: 2021-03-10 | NDC 00409909412

## 2021-03-10 MED ADMIN — SODIUM CHLORIDE 0.9 % IR SOLN [11403]: 1000 mL | @ 14:00:00 | Stop: 2021-03-10 | NDC 00338004804

## 2021-03-10 MED ADMIN — HYDROMORPHONE (PF) 2 MG/ML IJ SYRG [163476]: 0.5 mg | INTRAVENOUS | @ 16:00:00 | Stop: 2021-03-10 | NDC 00409131203

## 2021-03-10 MED ADMIN — METHOCARBAMOL 500 MG PO TAB [4971]: 1000 mg | ORAL | @ 17:00:00 | NDC 00904705761

## 2021-03-10 MED ADMIN — SODIUM CHLORIDE 0.9 % IV SOLP [27838]: 1000.000 mL | INTRAVENOUS | @ 16:00:00 | Stop: 2021-03-10 | NDC 00264999900

## 2021-03-10 MED ADMIN — GABAPENTIN 300 MG PO CAP [18308]: 600 mg | ORAL | @ 18:00:00 | NDC 00904666661

## 2021-03-10 MED ADMIN — THROMBIN (BOVINE) 5,000 UNIT TP SOLR [164515]: 5000 [IU] | TOPICAL | @ 14:00:00 | Stop: 2021-03-10 | NDC 60793031501

## 2021-03-10 MED ADMIN — OXYCODONE 5 MG PO TAB [10814]: 15 mg | ORAL | @ 18:00:00 | NDC 00904696661

## 2021-03-10 MED ADMIN — HYDROMORPHONE (PF) 2 MG/ML IJ SYRG [163476]: 1 mg | INTRAVENOUS | @ 17:00:00 | Stop: 2021-03-10 | NDC 00409131203

## 2021-03-10 MED ADMIN — SODIUM CHLORIDE 0.9 % IV SOLP [27838]: 1000 mL | INTRAVENOUS | @ 12:00:00 | Stop: 2021-03-10 | NDC 00338004904

## 2021-03-10 MED ADMIN — VANCOMYCIN 1,000 MG IV SOLR [8442]: 1 g | TOPICAL | @ 15:00:00 | Stop: 2021-03-10 | NDC 00409653311

## 2021-03-11 ENCOUNTER — Encounter: Admit: 2021-03-11 | Discharge: 2021-03-11 | Payer: BC Managed Care – HMO

## 2021-03-11 DIAGNOSIS — M4804 Spinal stenosis, thoracic region: Secondary | ICD-10-CM

## 2021-03-11 DIAGNOSIS — M4714 Other spondylosis with myelopathy, thoracic region: Secondary | ICD-10-CM

## 2021-03-11 MED ADMIN — OXCARBAZEPINE 150 MG PO TAB [77250]: 300 mg | ORAL | @ 02:00:00 | NDC 68084084511

## 2021-03-11 MED ADMIN — OXYCODONE 5 MG PO TAB [10814]: 20 mg | ORAL | @ 06:00:00 | NDC 00904696661

## 2021-03-11 MED ADMIN — OXYCODONE 5 MG PO TAB [10814]: 20 mg | ORAL | @ 17:00:00 | NDC 00904696661

## 2021-03-11 MED ADMIN — SENNOSIDES-DOCUSATE SODIUM 8.6-50 MG PO TAB [40926]: 1 | ORAL | @ 13:00:00 | NDC 70000052601

## 2021-03-11 MED ADMIN — DOCUSATE SODIUM 100 MG PO CAP [2566]: 100 mg | ORAL | @ 13:00:00 | NDC 00904718361

## 2021-03-11 MED ADMIN — GABAPENTIN 300 MG PO CAP [18308]: 600 mg | ORAL | @ 20:00:00 | NDC 00904666661

## 2021-03-11 MED ADMIN — OXYCODONE 5 MG PO TAB [10814]: 20 mg | ORAL | @ 08:00:00 | NDC 00904696661

## 2021-03-11 MED ADMIN — OXYCODONE SR 10 MG PO 12HR TABLET [323983]: 10 mg | ORAL | @ 22:00:00 | NDC 59011041020

## 2021-03-11 MED ADMIN — METHOCARBAMOL 500 MG PO TAB [4971]: 1000 mg | ORAL | @ 20:00:00 | NDC 00904705761

## 2021-03-11 MED ADMIN — METOPROLOL SUCCINATE 25 MG PO TB24 [81866]: 25 mg | ORAL | @ 13:00:00 | NDC 00904632261

## 2021-03-11 MED ADMIN — ACETAMINOPHEN 500 MG PO TAB [102]: 1000 mg | ORAL | @ 02:00:00 | NDC 00904673061

## 2021-03-11 MED ADMIN — DOCUSATE SODIUM 100 MG PO CAP [2566]: 100 mg | ORAL | @ 02:00:00 | NDC 00904718361

## 2021-03-11 MED ADMIN — BUSPIRONE 10 MG PO TAB [9323]: 20 mg | ORAL | @ 03:00:00 | NDC 00904712161

## 2021-03-11 MED ADMIN — CEFAZOLIN INJ 1GM IVP [210319]: 2 g | INTRAVENOUS | @ 06:00:00 | Stop: 2021-03-11 | NDC 60505614200

## 2021-03-11 MED ADMIN — ACETAMINOPHEN 500 MG PO TAB [102]: 1000 mg | ORAL | @ 20:00:00 | NDC 00904673061

## 2021-03-11 MED ADMIN — METHOCARBAMOL 500 MG PO TAB [4971]: 1000 mg | ORAL | @ 06:00:00 | NDC 00904705761

## 2021-03-11 MED ADMIN — GABAPENTIN 300 MG PO CAP [18308]: 600 mg | ORAL | @ 02:00:00 | NDC 00904666661

## 2021-03-11 MED ADMIN — METHOCARBAMOL 500 MG PO TAB [4971]: 1000 mg | ORAL | @ 13:00:00 | NDC 00904705761

## 2021-03-11 MED ADMIN — ACETAMINOPHEN 500 MG PO TAB [102]: 1000 mg | ORAL | @ 08:00:00 | NDC 00904673061

## 2021-03-11 MED ADMIN — OXYCODONE 5 MG PO TAB [10814]: 20 mg | ORAL | @ 13:00:00 | NDC 00904696661

## 2021-03-11 MED ADMIN — DULOXETINE 60 MG PO CPDR [93425]: 60 mg | ORAL | @ 02:00:00 | NDC 68084069211

## 2021-03-11 MED ADMIN — ACETAMINOPHEN 500 MG PO TAB [102]: 1000 mg | ORAL | @ 15:00:00 | NDC 00904673061

## 2021-03-11 MED ADMIN — GABAPENTIN 300 MG PO CAP [18308]: 600 mg | ORAL | @ 13:00:00 | NDC 00904666661

## 2021-03-11 MED ADMIN — LIDOCAINE 5 % TP PTMD [80759]: 2 | TOPICAL | @ 20:00:00 | NDC 00591352530

## 2021-03-11 MED ADMIN — OXCARBAZEPINE 150 MG PO TAB [77250]: 150 mg | ORAL | @ 13:00:00 | Stop: 2021-03-11 | NDC 68084084511

## 2021-03-11 MED ADMIN — DIAZEPAM 5 MG PO TAB [2405]: 5 mg | ORAL | @ 03:00:00 | NDC 51079028501

## 2021-03-11 MED ADMIN — SENNOSIDES-DOCUSATE SODIUM 8.6-50 MG PO TAB [40926]: 1 | ORAL | @ 02:00:00 | NDC 57896055510

## 2021-03-11 MED ADMIN — DULOXETINE 60 MG PO CPDR [93425]: 60 mg | ORAL | @ 13:00:00 | NDC 68084069211

## 2021-03-11 MED ADMIN — OXYCODONE 5 MG PO TAB [10814]: 20 mg | ORAL | @ 02:00:00 | NDC 00904696661

## 2021-03-11 MED ADMIN — CEFAZOLIN INJ 1GM IVP [210319]: 2 g | INTRAVENOUS | @ 13:00:00 | Stop: 2021-03-11 | NDC 60505614200

## 2021-03-11 MED ADMIN — DIAZEPAM 5 MG PO TAB [2405]: 5 mg | ORAL | @ 20:00:00 | Stop: 2021-03-11 | NDC 51079028501

## 2021-03-11 MED ADMIN — BUSPIRONE 10 MG PO TAB [9323]: 20 mg | ORAL | @ 13:00:00 | NDC 00904712161

## 2021-03-12 ENCOUNTER — Encounter: Admit: 2021-03-12 | Discharge: 2021-03-12 | Payer: BC Managed Care – HMO

## 2021-03-12 DIAGNOSIS — M4800 Spinal stenosis, site unspecified: Secondary | ICD-10-CM

## 2021-03-12 DIAGNOSIS — M1990 Unspecified osteoarthritis, unspecified site: Secondary | ICD-10-CM

## 2021-03-12 DIAGNOSIS — F419 Anxiety disorder, unspecified: Secondary | ICD-10-CM

## 2021-03-12 DIAGNOSIS — M2550 Pain in unspecified joint: Secondary | ICD-10-CM

## 2021-03-12 DIAGNOSIS — R000 Tachycardia, unspecified: Secondary | ICD-10-CM

## 2021-03-12 DIAGNOSIS — I10 Essential (primary) hypertension: Secondary | ICD-10-CM

## 2021-03-12 DIAGNOSIS — M5136 Other intervertebral disc degeneration, lumbar region: Secondary | ICD-10-CM

## 2021-03-12 DIAGNOSIS — T7840XA Allergy, unspecified, initial encounter: Secondary | ICD-10-CM

## 2021-03-12 MED ADMIN — OXCARBAZEPINE 150 MG PO TAB [77250]: 300 mg | ORAL | @ 01:00:00 | NDC 68084084511

## 2021-03-12 MED ADMIN — DOCUSATE SODIUM 100 MG PO CAP [2566]: 100 mg | ORAL | @ 14:00:00 | Stop: 2021-03-12 | NDC 00904718361

## 2021-03-12 MED ADMIN — OXYCODONE 5 MG PO TAB [10814]: 20 mg | ORAL | @ 05:00:00 | Stop: 2021-03-12 | NDC 00904696661

## 2021-03-12 MED ADMIN — OXCARBAZEPINE 150 MG PO TAB [77250]: 300 mg | ORAL | @ 14:00:00 | Stop: 2021-03-12 | NDC 68084084511

## 2021-03-12 MED ADMIN — DIAZEPAM 5 MG PO TAB [2405]: 5 mg | ORAL | @ 14:00:00 | Stop: 2021-03-12 | NDC 51079028501

## 2021-03-12 MED ADMIN — HEPARIN, PORCINE (PF) 5,000 UNIT/0.5 ML IJ SYRG [95535]: 5000 [IU] | SUBCUTANEOUS | @ 11:00:00 | Stop: 2021-03-12 | NDC 00409131611

## 2021-03-12 MED ADMIN — DIAZEPAM 5 MG PO TAB [2405]: 5 mg | ORAL | @ 01:00:00 | NDC 51079028501

## 2021-03-12 MED ADMIN — ACETAMINOPHEN 500 MG PO TAB [102]: 1000 mg | ORAL | @ 01:00:00 | NDC 00904673061

## 2021-03-12 MED ADMIN — METHOCARBAMOL 500 MG PO TAB [4971]: 1000 mg | ORAL | @ 08:00:00 | Stop: 2021-03-12 | NDC 00904705761

## 2021-03-12 MED ADMIN — BUSPIRONE 10 MG PO TAB [9323]: 20 mg | ORAL | @ 01:00:00 | NDC 00904712161

## 2021-03-12 MED ADMIN — ACETAMINOPHEN 500 MG PO TAB [102]: 1000 mg | ORAL | @ 08:00:00 | Stop: 2021-03-12 | NDC 00904673061

## 2021-03-12 MED ADMIN — METHOCARBAMOL 500 MG PO TAB [4971]: 1000 mg | ORAL | @ 14:00:00 | Stop: 2021-03-12 | NDC 00904705761

## 2021-03-12 MED ADMIN — METHOCARBAMOL 500 MG PO TAB [4971]: 1000 mg | ORAL | @ 01:00:00 | NDC 00904705761

## 2021-03-12 MED ADMIN — OXYCODONE 5 MG PO TAB [10814]: 20 mg | ORAL | @ 13:00:00 | Stop: 2021-03-12 | NDC 00904696661

## 2021-03-12 MED ADMIN — DULOXETINE 60 MG PO CPDR [93425]: 60 mg | ORAL | @ 14:00:00 | Stop: 2021-03-12 | NDC 68084069211

## 2021-03-12 MED ADMIN — SENNOSIDES-DOCUSATE SODIUM 8.6-50 MG PO TAB [40926]: 1 | ORAL | @ 14:00:00 | Stop: 2021-03-12 | NDC 57896055510

## 2021-03-12 MED ADMIN — METOPROLOL SUCCINATE 25 MG PO TB24 [81866]: 25 mg | ORAL | @ 14:00:00 | Stop: 2021-03-12 | NDC 00904632261

## 2021-03-12 MED ADMIN — HEPARIN, PORCINE (PF) 5,000 UNIT/0.5 ML IJ SYRG [95535]: 5000 [IU] | SUBCUTANEOUS | @ 03:00:00 | NDC 00409131611

## 2021-03-12 MED ADMIN — ACETAMINOPHEN 500 MG PO TAB [102]: 1000 mg | ORAL | @ 14:00:00 | Stop: 2021-03-12 | NDC 00904673061

## 2021-03-12 MED ADMIN — GABAPENTIN 300 MG PO CAP [18308]: 600 mg | ORAL | @ 01:00:00 | NDC 00904666661

## 2021-03-12 MED ADMIN — OXYCODONE 5 MG PO TAB [10814]: 20 mg | ORAL | @ 01:00:00 | NDC 00904696661

## 2021-03-12 MED ADMIN — DULOXETINE 60 MG PO CPDR [93425]: 60 mg | ORAL | @ 01:00:00 | NDC 68084069211

## 2021-03-12 MED ADMIN — OXYCODONE SR 10 MG PO 12HR TABLET [323983]: 10 mg | ORAL | @ 11:00:00 | Stop: 2021-03-12 | NDC 59011041020

## 2021-03-12 MED ADMIN — DOCUSATE SODIUM 100 MG PO CAP [2566]: 100 mg | ORAL | @ 01:00:00 | NDC 00904718361

## 2021-03-12 MED ADMIN — SENNOSIDES-DOCUSATE SODIUM 8.6-50 MG PO TAB [40926]: 1 | ORAL | @ 01:00:00 | NDC 57896055510

## 2021-03-12 MED ADMIN — BUSPIRONE 10 MG PO TAB [9323]: 20 mg | ORAL | @ 14:00:00 | Stop: 2021-03-12 | NDC 00904712161

## 2021-03-12 MED ADMIN — LIDOCAINE 5 % TP PTMD [80759]: 2 | TOPICAL | @ 14:00:00 | Stop: 2021-03-12 | NDC 00591352530

## 2021-03-12 MED ADMIN — GABAPENTIN 300 MG PO CAP [18308]: 600 mg | ORAL | @ 14:00:00 | Stop: 2021-03-12 | NDC 00904666661

## 2021-03-12 MED FILL — METHOCARBAMOL 500 MG PO TAB: 500 mg | ORAL | 8 days supply | Qty: 65 | Fill #1 | Status: CP

## 2021-03-18 ENCOUNTER — Encounter: Admit: 2021-03-18 | Discharge: 2021-03-18 | Payer: BC Managed Care – HMO

## 2021-03-18 NOTE — Telephone Encounter
Spoke with Troy Matthews tonight regarding recovery.  He reports recovery has been "tough with a lot of pain", he reports he forgets sometimes about back surgery and when he sits down too quickly he feels a lot of pain in incision.  Troy Matthews reports he is still taking Robaxin several times a day, but requests a refill for his Oxycodone.  Inquired on recent Oxycodone use, he reports he thought he could take 20 mg every 4-6 hours until 03/27/21.  Reviewed in AVS taper pain instructions that were reviewed at bedside prior to discharge, advised that was for first 2 days at home, Troy Matthews states he misunderstood it.  He reports he has a few left of his home Oxycodone, a day or two on his Robaxin and a few Valium still at home.  Advised I will send a refill request to Dr. Earlene Plater, he will be notified tomorrow if this is approved, Troy Matthews in agreement.  Troy Matthews states incision is healing well so far, no drainage and endorses a lot of itching recently.  He states mobility in home is improved, he uses a cane a times for stability.  Troy Matthews endorses improving appetite, good fluid intake and regular bowel movements with stool softeners.  Troy Matthews denies any further needs/questions, encouraged to call with any changes/concerns.        Kem Kays, RN  Clinical Nurse Coordinator  Neurosurgery  [phone]

## 2021-03-19 ENCOUNTER — Encounter: Admit: 2021-03-19 | Discharge: 2021-03-19 | Payer: BC Managed Care – HMO

## 2021-03-20 ENCOUNTER — Encounter: Admit: 2021-03-20 | Discharge: 2021-03-20 | Payer: BC Managed Care – HMO

## 2021-03-20 NOTE — Telephone Encounter
Received VM from [facility] pharmacy stating they have no received any RX for oxycodone as prescription was possibly sent to another pharmacy.  RN called pharmacy and spoke with [facility] who states the prescription has now been received and should be filled today.

## 2021-03-23 ENCOUNTER — Encounter: Admit: 2021-03-23 | Discharge: 2021-03-23 | Payer: BC Managed Care – HMO

## 2021-03-23 NOTE — Patient Instructions
It was nice to see you today.  Thank you for choosing to visit our clinic.  Your time is important, and if you had to wait today, we do apologize.  Our goal is to run exactly on time.  However, on occasion, we get behind in clinic due to unexpected patient issues.  Thank you for your patience.    General Instructions:   Scheduling:  Our scheduling phone number is [phone].   Appointment Reminders on your cell phone:  Make sure we have your cell phone number in your account, and text [place] to [phone].   How to reach our office:  Please send a MyChart message to the Sp[facility] Center (directed to Dr. Davis) or leave a voicemail for the nurse, Rishab Stoudt, at [phone].   How to get a medication refill:  Please use the MyChart Refill request or contact your pharmacy directly to request medication refills.  Please allow 72 business hours for request to be completed.     Support for many chronic illnesses is available through [facility] at [email or web address] or [phone].     For help with MyChart:  please call [phone].     For questions on nights, weekends or holidays:  call the Operator at [phone], and ask for the doctor on call for Neurosurgery.     For more information on spinal conditions:  please visit [email or web address]     Again, thank you for coming in today.

## 2021-03-25 ENCOUNTER — Encounter: Admit: 2021-03-25 | Discharge: 2021-03-25 | Payer: BC Managed Care – HMO

## 2021-03-25 ENCOUNTER — Ambulatory Visit: Admit: 2021-03-25 | Discharge: 2021-03-26 | Payer: BC Managed Care – HMO

## 2021-03-25 DIAGNOSIS — T7840XA Allergy, unspecified, initial encounter: Secondary | ICD-10-CM

## 2021-03-25 DIAGNOSIS — M2550 Pain in unspecified joint: Secondary | ICD-10-CM

## 2021-03-25 DIAGNOSIS — M4800 Spinal stenosis, site unspecified: Secondary | ICD-10-CM

## 2021-03-25 DIAGNOSIS — M5136 Other intervertebral disc degeneration, lumbar region: Secondary | ICD-10-CM

## 2021-03-25 DIAGNOSIS — R000 Tachycardia, unspecified: Secondary | ICD-10-CM

## 2021-03-25 DIAGNOSIS — F419 Anxiety disorder, unspecified: Secondary | ICD-10-CM

## 2021-03-25 DIAGNOSIS — M1990 Unspecified osteoarthritis, unspecified site: Secondary | ICD-10-CM

## 2021-03-25 DIAGNOSIS — I10 Essential (primary) hypertension: Secondary | ICD-10-CM

## 2021-03-25 DIAGNOSIS — M4804 Spinal stenosis, thoracic region: Secondary | ICD-10-CM

## 2021-03-25 NOTE — Progress Notes
Todays visit took place via face-to-face encounter utilizing Zoom application.      Subjective:       History of Present Illness  Troy Matthews is a 45 y.o. male.  He presents for follow-up of his T10-11 laminectomy with instrumented fusion on 03/10/2021.  He says he only has mild periincisional pain at this time.  States his legs feel better and he can tell that there is going to be significant improvement.  He has no significant complaints at this time.  He states his incision is healing well.             Objective:         ? acetaminophen (TYLENOL EXTRA STRENGTH) 500 mg tablet Take two tablets by mouth every 6 hours as needed for Pain. Max of 4,000 mg of acetaminophen in 24 hours.  Indications: pain   ? busPIRone (BUSPAR) 10 mg tablet Take 20 mg by mouth twice daily.   ? calcium carbonate (TUMS) 500 mg (200 mg elemental calcium) chewable tablet Chew 1 tablet by mouth daily as needed. Indications: heartburn   ? diazePAM (VALIUM) 5 mg tablet Take 5 mg by mouth twice daily as needed for Anxiety.   ? duloxetine DR (CYMBALTA) 60 mg capsule Take 60 mg by mouth twice daily.   ? gabapentin (NEURONTIN) 300 mg capsule Take 600 mg by mouth three times daily.   ? lidocaine (LIDODERM) 5 % topical patch Apply one patch topically to affected area daily. Apply patch for 12 hours, then remove for 12 hours before repeating.   ? methocarbamoL (ROBAXIN) 500 mg tablet Take two tablets by mouth four times daily as needed for Muscle Cramps or Spasms.   ? metoprolol XL (TOPROL XL) 25 mg extended release tablet Take 25 mg by mouth daily.   ? NARCAN 4 mg/actuation nasal spray ADMINISTER A SINGLE SPRAY INTRANASALLY INTO ONE NOSTRIL CALL 911 MAY REPEAT ONE TIME   ? OXcarbazepine (TRILEPTAL) 300 mg tablet 300 mg twice daily.   ? oxyCODONE 10 mg tablet Take one tablet by mouth every 6 hours as needed for Pain for up to 5 days.   ? oxyCODONE 10 mg tablet Take one tablet by mouth twice daily as needed for Pain. Do not start before March 18, 2021.  May resume once oxycodone taper dosing completed.   ? senna/docusate (SENOKOT-S) 8.6/50 mg tablet Take one tablet by mouth twice daily as needed. Indications: constipation   ? sildenafiL (VIAGRA) 50 mg tablet Take 50 mg by mouth as Needed.   ? vitamins, multiple tablet Take 1 tablet by mouth daily.     There were no vitals filed for this visit.  There is no height or weight on file to calculate BMI.     Physical Exam  Constitutional:       Appearance: Normal appearance.   HENT:      Head: Normocephalic and atraumatic.   Pulmonary:      Effort: Pulmonary effort is normal.   Neurological:      Mental Status: He is alert and oriented to person, place, and time.   Psychiatric:         Mood and Affect: Mood normal.         Behavior: Behavior normal.              Assessment and Plan:     Patient is recovering appropriately from his surgery.  I will see him back as previously scheduled.

## 2021-03-28 ENCOUNTER — Encounter: Admit: 2021-03-28 | Discharge: 2021-03-28 | Payer: BC Managed Care – HMO

## 2021-03-28 NOTE — Telephone Encounter
Patient callback received from the patient. He states he noticed some redness at his incision today. He states it appears as though there is a stitch protruding from the incision. He denies fevers, chills, or wound drainage.     He was encouraged to send a picture of the incision via myChart. We could discuss providing skin prophylactic antibiotics depending what the picture looks likes. He was understanding with this plan.     Newman Nickels, MD

## 2021-05-19 NOTE — Patient Instructions
It was nice to see you today.  Thank you for choosing to visit our clinic.  Your time is important, and if you had to wait today, we do apologize.  Our goal is to run exactly on time.  However, on occasion, we get behind in clinic due to unexpected patient issues.  Thank you for your patience.    General Instructions:   Scheduling:  Our scheduling phone number is [phone].   Appointment Reminders on your cell phone:  Communication preferences can be managed in MyChart to ensure you receive important appointment notifications   How to reach our office:  Please send a MyChart message to the [facility] Center (directed to Dr. Davis) or leave a voicemail for the nurse, Jannis Atkins, at [phone].   How to get a medication refill:  Please use the MyChart Refill request or contact your pharmacy directly to request medication refills.  Please allow 72 business hours for request to be completed.     Support for many chronic illnesses is available through [facility] at [email or web address] or [phone].     For help with MyChart:  please call [phone].     For questions on nights, weekends or holidays:  call the Operator at [phone], and ask for the doctor on call for Neurosurgery.     For more information on spinal conditions:  please visit [email or web address]     Again, thank you for coming in today.

## 2021-05-20 ENCOUNTER — Encounter: Admit: 2021-05-20 | Discharge: 2021-05-20 | Payer: BC Managed Care – HMO

## 2021-05-20 ENCOUNTER — Ambulatory Visit: Admit: 2021-05-20 | Discharge: 2021-05-20 | Payer: BC Managed Care – HMO

## 2021-05-20 DIAGNOSIS — M4800 Spinal stenosis, site unspecified: Secondary | ICD-10-CM

## 2021-05-20 DIAGNOSIS — F419 Anxiety disorder, unspecified: Secondary | ICD-10-CM

## 2021-05-20 DIAGNOSIS — F32A Depression: Secondary | ICD-10-CM

## 2021-05-20 DIAGNOSIS — M4804 Spinal stenosis, thoracic region: Secondary | ICD-10-CM

## 2021-05-20 DIAGNOSIS — M5136 Other intervertebral disc degeneration, lumbar region: Secondary | ICD-10-CM

## 2021-05-20 DIAGNOSIS — M1990 Unspecified osteoarthritis, unspecified site: Secondary | ICD-10-CM

## 2021-05-20 DIAGNOSIS — M2550 Pain in unspecified joint: Secondary | ICD-10-CM

## 2021-05-20 DIAGNOSIS — I10 Essential (primary) hypertension: Secondary | ICD-10-CM

## 2021-05-20 DIAGNOSIS — T7840XA Allergy, unspecified, initial encounter: Secondary | ICD-10-CM

## 2021-05-20 DIAGNOSIS — M4714 Other spondylosis with myelopathy, thoracic region: Secondary | ICD-10-CM

## 2021-05-20 DIAGNOSIS — Z981 Arthrodesis status: Secondary | ICD-10-CM

## 2021-05-20 DIAGNOSIS — R000 Tachycardia, unspecified: Secondary | ICD-10-CM

## 2021-05-20 NOTE — Progress Notes
Subjective:       History of Present Illness  Troy Matthews is a 45 y.o. male.  He presents for follow-up of his T10-11 laminectomy and instrumented fusion on 03/10/2021.  Overall he is doing very well states that his legs continue to slowly improve.  He denies significant pain at this time.  He feels he is doing much better than he was prior to surgery.             Objective:         ? acetaminophen (TYLENOL EXTRA STRENGTH) 500 mg tablet Take two tablets by mouth every 6 hours as needed for Pain. Max of 4,000 mg of acetaminophen in 24 hours.  Indications: pain   ? busPIRone (BUSPAR) 10 mg tablet Take 20 mg by mouth twice daily.   ? calcium carbonate (TUMS) 500 mg (200 mg elemental calcium) chewable tablet Chew 1 tablet by mouth daily as needed. Indications: heartburn   ? diazePAM (VALIUM) 5 mg tablet Take 5 mg by mouth twice daily as needed for Anxiety.   ? duloxetine DR (CYMBALTA) 60 mg capsule Take 60 mg by mouth twice daily.   ? gabapentin (NEURONTIN) 300 mg capsule Take 600 mg by mouth three times daily.   ? lidocaine (LIDODERM) 5 % topical patch Apply one patch topically to affected area daily. Apply patch for 12 hours, then remove for 12 hours before repeating.   ? methocarbamoL (ROBAXIN) 500 mg tablet Take two tablets by mouth four times daily as needed for Muscle Cramps or Spasms.   ? metoprolol XL (TOPROL XL) 25 mg extended release tablet Take 25 mg by mouth daily.   ? NARCAN 4 mg/actuation nasal spray ADMINISTER A SINGLE SPRAY INTRANASALLY INTO ONE NOSTRIL CALL 911 MAY REPEAT ONE TIME   ? OXcarbazepine (TRILEPTAL) 300 mg tablet 300 mg twice daily.   ? oxyCODONE 10 mg tablet Take one tablet by mouth twice daily as needed for Pain. Do not start before March 18, 2021.  May resume once oxycodone taper dosing completed.   ? senna/docusate (SENOKOT-S) 8.6/50 mg tablet Take one tablet by mouth twice daily as needed. Indications: constipation   ? sildenafiL (VIAGRA) 50 mg tablet Take 50 mg by mouth as Needed. ? vitamins, multiple tablet Take 1 tablet by mouth daily.     Vitals:    05/20/21 1600   BP: 131/87   Pulse: 99   SpO2: 99%   PainSc: Nine   Weight: 109.8 kg (242 lb)   Height: 172.7 cm (5' 8)     Body mass index is 36.8 kg/m?Marland Kitchen     Physical Exam  Awake, alert, oriented x4  Grossly neurologically intact  Incision well-healed    I independent reviewed thoracic films from clinic today which demonstrate good, stable placement hardware without abnormal motion on flexion/extension views.       Assessment and Plan:     Patient is recovering well from surgery.  I will see him back in 2 3 months with repeat thoracic films.

## 2021-07-10 ENCOUNTER — Encounter: Admit: 2021-07-10 | Discharge: 2021-07-10 | Payer: BC Managed Care – HMO

## 2021-07-10 DIAGNOSIS — Z981 Arthrodesis status: Secondary | ICD-10-CM

## 2021-07-22 ENCOUNTER — Encounter: Admit: 2021-07-22 | Discharge: 2021-07-22 | Payer: BC Managed Care – HMO

## 2021-07-27 ENCOUNTER — Encounter: Admit: 2021-07-27 | Discharge: 2021-07-27 | Payer: BC Managed Care – HMO

## 2021-08-12 ENCOUNTER — Encounter: Admit: 2021-08-12 | Discharge: 2021-08-12 | Payer: BC Managed Care – HMO

## 2021-08-25 ENCOUNTER — Encounter: Admit: 2021-08-25 | Discharge: 2021-08-25 | Payer: BC Managed Care – HMO

## 2021-08-25 NOTE — Patient Instructions
It was nice to see you today.  Thank you for choosing to visit our clinic.  Your time is important, and if you had to wait today, we do apologize.  Our goal is to run exactly on time.  However, on occasion, we get behind in clinic due to unexpected patient issues.  Thank you for your patience.    General Instructions:  Scheduling:  Our scheduling phone number is [phone].  Appointment Reminders on your cell phone:  Communication preferences can be managed in MyChart to ensure you receive important appointment notifications  How to reach our office:  Please send a MyChart message to the [facility] (directed to Dr. Davis) or leave a voicemail for the nurse, Lileigh Fahringer, at [phone].  How to get a medication refill:  Please use the MyChart Refill request or contact your pharmacy directly to request medication refills.  Please allow 72 business hours for request to be completed.    Support for many chronic illnesses is available through [facility] at [email or web address] or [phone].    For help with MyChart:  please call [phone].    For questions on nights, weekends or holidays:  call the Operator at [phone], and ask for the doctor on call for Neurosurgery.    For more information on spinal conditions:  please visit [email or web address]     Again, thank you for coming in today.

## 2021-09-02 ENCOUNTER — Encounter: Admit: 2021-09-02 | Discharge: 2021-09-02 | Payer: BC Managed Care – HMO

## 2021-09-02 ENCOUNTER — Ambulatory Visit: Admit: 2021-09-02 | Discharge: 2021-09-03 | Payer: BC Managed Care – HMO

## 2021-09-02 DIAGNOSIS — M5136 Other intervertebral disc degeneration, lumbar region: Secondary | ICD-10-CM

## 2021-09-02 DIAGNOSIS — R000 Tachycardia, unspecified: Secondary | ICD-10-CM

## 2021-09-02 DIAGNOSIS — M1990 Unspecified osteoarthritis, unspecified site: Secondary | ICD-10-CM

## 2021-09-02 DIAGNOSIS — I10 Essential (primary) hypertension: Secondary | ICD-10-CM

## 2021-09-02 DIAGNOSIS — M4800 Spinal stenosis, site unspecified: Secondary | ICD-10-CM

## 2021-09-02 DIAGNOSIS — M4804 Spinal stenosis, thoracic region: Secondary | ICD-10-CM

## 2021-09-02 DIAGNOSIS — F32A Depression: Secondary | ICD-10-CM

## 2021-09-02 DIAGNOSIS — T7840XA Allergy, unspecified, initial encounter: Secondary | ICD-10-CM

## 2021-09-02 DIAGNOSIS — M2550 Pain in unspecified joint: Secondary | ICD-10-CM

## 2021-09-02 DIAGNOSIS — F419 Anxiety disorder, unspecified: Secondary | ICD-10-CM

## 2021-09-02 DIAGNOSIS — Z981 Arthrodesis status: Secondary | ICD-10-CM

## 2021-09-02 NOTE — Progress Notes
Todays visit took place via face-to-face encounter utilizing Zoom application.       Subjective:       History of Present Illness  Troy Matthews is a 45 y.o. male.  He presents for follow-up of his T10-11 laminectomy with instrumented fusion on 03/10/2021.  He states he is doing well and continues to improve each day.  He does have a little bit of a setback approximately a month and a half ago when his wife accidentally fell on top of him however he is recovered from this and continues to improve.  He had x-rays at that time which showed no problems with his hardware per his report.  He has no other significant complaints today.           Objective:         ? acetaminophen (TYLENOL EXTRA STRENGTH) 500 mg tablet Take two tablets by mouth every 6 hours as needed for Pain. Max of 4,000 mg of acetaminophen in 24 hours.  Indications: pain   ? busPIRone (BUSPAR) 10 mg tablet Take 20 mg by mouth twice daily.   ? calcium carbonate (TUMS) 500 mg (200 mg elemental calcium) chewable tablet Chew 1 tablet by mouth daily as needed. Indications: heartburn   ? diazePAM (VALIUM) 5 mg tablet Take 5 mg by mouth twice daily as needed for Anxiety.   ? duloxetine DR (CYMBALTA) 60 mg capsule Take 60 mg by mouth twice daily.   ? gabapentin (NEURONTIN) 300 mg capsule Take 600 mg by mouth three times daily.   ? lidocaine (LIDODERM) 5 % topical patch Apply one patch topically to affected area daily. Apply patch for 12 hours, then remove for 12 hours before repeating.   ? methocarbamoL (ROBAXIN) 500 mg tablet Take two tablets by mouth four times daily as needed for Muscle Cramps or Spasms.   ? metoprolol XL (TOPROL XL) 25 mg extended release tablet Take 25 mg by mouth daily.   ? NARCAN 4 mg/actuation nasal spray ADMINISTER A SINGLE SPRAY INTRANASALLY INTO ONE NOSTRIL CALL 911 MAY REPEAT ONE TIME   ? OXcarbazepine (TRILEPTAL) 300 mg tablet 300 mg twice daily.   ? oxyCODONE 10 mg tablet Take one tablet by mouth twice daily as needed for Pain. Do not start before March 18, 2021.  May resume once oxycodone taper dosing completed.   ? senna/docusate (SENOKOT-S) 8.6/50 mg tablet Take one tablet by mouth twice daily as needed. Indications: constipation   ? sildenafiL (VIAGRA) 50 mg tablet Take 50 mg by mouth as Needed.   ? vitamins, multiple tablet Take 1 tablet by mouth daily.     Vitals:    09/02/21 1131   Weight: 109.8 kg (242 lb)   Height: 172.7 cm (5' 8)     Body mass index is 36.8 kg/m?Marland Kitchen     Physical Exam  Constitutional:       Appearance: Normal appearance.   HENT:      Head: Normocephalic and atraumatic.   Pulmonary:      Effort: Pulmonary effort is normal.   Neurological:      Mental Status: He is alert and oriented to person, place, and time.   Psychiatric:         Mood and Affect: Mood normal.         Behavior: Behavior normal.              Assessment and Plan:     Patient has recovered well from his surgery.  I  will see him back on an as-needed basis.

## 2021-10-14 ENCOUNTER — Encounter: Admit: 2021-10-14 | Discharge: 2021-10-14 | Payer: BC Managed Care – HMO

## 2022-02-11 ENCOUNTER — Encounter: Admit: 2022-02-11 | Discharge: 2022-02-11 | Payer: MEDICARE

## 2022-02-11 NOTE — Telephone Encounter
Spoke with patient. I let him know that he would need to call medicail records to get the medical information that he was requesting for social security disability. He let me know that his wife had already started the on-line process.

## 2022-03-02 ENCOUNTER — Encounter: Admit: 2022-03-02 | Discharge: 2022-03-02 | Payer: MEDICARE

## 2022-03-02 ENCOUNTER — Ambulatory Visit: Admit: 2022-03-02 | Discharge: 2022-03-03 | Payer: MEDICARE

## 2022-03-02 DIAGNOSIS — M47816 Spondylosis without myelopathy or radiculopathy, lumbar region: Secondary | ICD-10-CM

## 2022-03-02 DIAGNOSIS — M5136 Other intervertebral disc degeneration, lumbar region: Secondary | ICD-10-CM

## 2022-03-02 DIAGNOSIS — T7840XA Allergy, unspecified, initial encounter: Secondary | ICD-10-CM

## 2022-03-02 DIAGNOSIS — R000 Tachycardia, unspecified: Secondary | ICD-10-CM

## 2022-03-02 DIAGNOSIS — M1990 Unspecified osteoarthritis, unspecified site: Secondary | ICD-10-CM

## 2022-03-02 DIAGNOSIS — M4800 Spinal stenosis, site unspecified: Secondary | ICD-10-CM

## 2022-03-02 DIAGNOSIS — M5416 Radiculopathy, lumbar region: Secondary | ICD-10-CM

## 2022-03-02 DIAGNOSIS — M2550 Pain in unspecified joint: Secondary | ICD-10-CM

## 2022-03-02 DIAGNOSIS — F32A Depression: Secondary | ICD-10-CM

## 2022-03-02 DIAGNOSIS — I10 Essential (primary) hypertension: Secondary | ICD-10-CM

## 2022-03-02 DIAGNOSIS — F419 Anxiety disorder, unspecified: Secondary | ICD-10-CM

## 2022-03-02 NOTE — Progress Notes
Date of Service: 03/02/2022    Subjective:             Troy Matthews is a 46 y.o. male.    History of Present Illness  Cont back pain  Saw Dr Tamsen Snider Thoracic fusion  Cont low back pain  Recent imaging present  Some weakness  Not progressive  Has recently completed PT  Cont medications     Review of Systems      Objective:         ? acetaminophen (TYLENOL EXTRA STRENGTH) 500 mg tablet Take two tablets by mouth every 6 hours as needed for Pain. Max of 4,000 mg of acetaminophen in 24 hours.  Indications: pain   ? busPIRone (BUSPAR) 10 mg tablet Take two tablets by mouth twice daily.   ? calcium carbonate (TUMS) 500 mg (200 mg elemental calcium) chewable tablet Chew one tablet by mouth daily as needed. Indications: heartburn   ? diazePAM (VALIUM) 5 mg tablet Take one tablet by mouth twice daily as needed for Anxiety.   ? duloxetine DR (CYMBALTA) 60 mg capsule Take one capsule by mouth twice daily.   ? gabapentin (NEURONTIN) 300 mg capsule Take two capsules by mouth three times daily.   ? HYDROcodone/acetaminophen (NORCO) 10/325 mg tablet Take one tablet by mouth every 6 hours as needed for Pain.   ? lidocaine (LIDODERM) 5 % topical patch Apply one patch topically to affected area daily. Apply patch for 12 hours, then remove for 12 hours before repeating.   ? methocarbamoL (ROBAXIN) 500 mg tablet Take two tablets by mouth four times daily as needed for Muscle Cramps or Spasms.   ? metoprolol XL (TOPROL XL) 25 mg extended release tablet Take one tablet by mouth daily.   ? NARCAN 4 mg/actuation nasal spray ADMINISTER A SINGLE SPRAY INTRANASALLY INTO ONE NOSTRIL CALL 911 MAY REPEAT ONE TIME   ? OXcarbazepine (TRILEPTAL) 300 mg tablet one tablet twice daily.   ? oxyCODONE 10 mg tablet Take one tablet by mouth twice daily as needed for Pain. Do not start before March 18, 2021.  May resume once oxycodone taper dosing completed.   ? senna/docusate (SENOKOT-S) 8.6/50 mg tablet Take one tablet by mouth twice daily as needed. Indications: constipation   ? sildenafiL (VIAGRA) 50 mg tablet Take one tablet by mouth as Needed.   ? vitamins, multiple tablet Take one tablet by mouth daily.     Vitals:    03/02/22 0830   BP: (!) 141/96   Pulse: 81   Temp: 36.9 ?C (98.4 ?F)   TempSrc: Skin   PainSc: Six   Weight: 112 kg (247 lb)   Height: 172.7 cm (5' 8)  Comment: per pt     Body mass index is 37.56 kg/m?Marland Kitchen     Physical Exam    General Appearance: moderately overweight, no distress     Respiratory Effort: breathing comfortably, no respiratory distress    Gait & Station: walks with assistance   Muscle Strength: normal muscle tone   Orientation: oriented to time, place and person   Affect & Mood: appropriate and sustained affect   Language and Memory: patient responsive and seems to comprehend information   Neurologic Exam: neurological assessment grossly intact   Other: moves all extremities         Assessment and Plan:    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 Lumbar radiculopathy  3. Lumbar spondylosis            Rev recordsa nd imaging  Discussed pain soruces  Cont exercises and home program  Cont medications  Repeat ESI at L45  Risks discussed  If no sustaine dimprovement consider MBB and RFA  Questions answered

## 2022-04-05 ENCOUNTER — Ambulatory Visit: Admit: 2022-04-05 | Discharge: 2022-04-05 | Payer: MEDICARE

## 2022-04-05 ENCOUNTER — Encounter: Admit: 2022-04-05 | Discharge: 2022-04-05 | Payer: MEDICARE

## 2022-04-05 DIAGNOSIS — R000 Tachycardia, unspecified: Secondary | ICD-10-CM

## 2022-04-05 DIAGNOSIS — M2550 Pain in unspecified joint: Secondary | ICD-10-CM

## 2022-04-05 DIAGNOSIS — M48062 Spinal stenosis, lumbar region with neurogenic claudication: Secondary | ICD-10-CM

## 2022-04-05 DIAGNOSIS — M1990 Unspecified osteoarthritis, unspecified site: Secondary | ICD-10-CM

## 2022-04-05 DIAGNOSIS — I10 Essential (primary) hypertension: Secondary | ICD-10-CM

## 2022-04-05 DIAGNOSIS — M4800 Spinal stenosis, site unspecified: Secondary | ICD-10-CM

## 2022-04-05 DIAGNOSIS — F419 Anxiety disorder, unspecified: Secondary | ICD-10-CM

## 2022-04-05 DIAGNOSIS — T7840XA Allergy, unspecified, initial encounter: Secondary | ICD-10-CM

## 2022-04-05 DIAGNOSIS — F32A Depression: Secondary | ICD-10-CM

## 2022-04-05 DIAGNOSIS — M5136 Other intervertebral disc degeneration, lumbar region: Secondary | ICD-10-CM

## 2022-04-05 DIAGNOSIS — M5416 Radiculopathy, lumbar region: Secondary | ICD-10-CM

## 2022-04-05 MED ORDER — METHYLPREDNISOLONE ACETATE 40 MG/ML IJ SUSP
40 mg | Freq: Once | INTRAMUSCULAR | 0 refills | Status: CP
Start: 2022-04-05 — End: ?

## 2022-04-05 MED ORDER — IOHEXOL 240 MG IODINE/ML IV SOLN
1 mL | Freq: Once | EPIDURAL | 0 refills | Status: CP
Start: 2022-04-05 — End: ?

## 2022-04-05 MED ORDER — LIDOCAINE (PF) 10 MG/ML (1 %) IJ SOLN
2 mL | Freq: Once | INTRAMUSCULAR | 0 refills | Status: CP
Start: 2022-04-05 — End: ?

## 2022-04-05 NOTE — Progress Notes
[facility]  INTERVENTIONAL PAIN PROCEDURE HISTORY AND PHYSICAL    Chief Complaint: Pain    HISTORY OF PRESENT ILLNESS:  pain    Medical History:   Diagnosis Date   ? Allergies     Environmental, not current   ? Anxiety    ? Arthritis     Back   ? 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 Depression Extremely bad right now   ? Hypertension    ? Joint pain Have had for long time   ? Spinal stenosis 04/17/2018   ? Tachycardia        Surgical History:   Procedure Laterality Date   ? LAMINECTOMY  12/08/2018   ? MINIMALLY INVASIVE LUMBAR 3 LAMINECTOMY N/A 12/08/2018    Performed by Pablo Brookside, MD at [facility]   ? Thoracic 10-11 laminectomy with instrumented fusion N/A 03/10/2021    Performed by Pablo [place], MD at [facility]   ? FUSION SPINE POSTERIOR - THORACIC N/A 03/10/2021    Performed by Pablo [place], MD at [facility]   ? POSTERIOR NON-SEGMENTAL INSTRUMENTATION SPINE N/A 03/10/2021    Performed by Pablo Buhl, MD at [facility]   ? ROBOT ASSISTED STEREOTACTIC COMPUTER ASSISTED PROCEDURE-SPINAL N/A 03/10/2021    Performed by Pablo [place], MD at [facility]   ? WISDOM TEETH EXTRACTION         family history includes Arthritis in his father, mother, and sister; Back pain in his brother, father, mother, and sister; Cancer in his maternal grandmother and mother; Heart problem in his daughter; Hypertension in his father; Joint Pain in his mother; Neck Pain in his mother; Osteoporosis in his mother; Scoliosis in his brother and sister.    Social History     Socioeconomic History   ? Marital status: Married   Tobacco Use   ? Smoking status: Some Days     Types: Cigars   ? Smokeless tobacco: Never   ? Tobacco comments:     cigars/cigarretts socially, not daily   Vaping Use   ? Vaping Use: Never used   Substance and Sexual Activity   ? Alcohol use: Yes     Alcohol/week: 4.0 standard drinks of alcohol     Types: 4 Cans of beer per week     Comment: daily   ? Drug use: Never   ? Sexual activity: Yes     Partners: Female     Birth control/protection: None       No Known Allergies    Vitals:    04/05/22 1342   BP: (!) 143/98   BP Source: Arm, Left Upper   Temp: 37.6 ?C (99.7 ?F)   SpO2: 99%             REVIEW OF SYSTEMS: 10 point ROS obtained and negative except pain      PHYSICAL EXAM:unchanged        IMPRESSION:    1. Lumbar stenosis with neurogenic claudication    2. Lumbar radiculopathy         PLAN: Lumbar Epidural Steroid Injection

## 2022-04-05 NOTE — Procedures
Attending Surgeon: Jerilynn Som, MD    Anesthesia: Local    Pre-Procedure Diagnosis:   1. Lumbar stenosis with neurogenic claudication    2. Lumbar radiculopathy        Post-Procedure Diagnosis:   1. Lumbar stenosis with neurogenic claudication    2. Lumbar radiculopathy             [place] [facility] INJECT INTERLAM LMBR/SAC  Procedure: epidural - interlaminar    Laterality: n/a   on 04/05/2022 1:24 PM  Location: lumbar -  L4-5      Consent:   Consent obtained: written  Consent given by: patient  Risks discussed: allergic reaction, bleeding, bruising, infection, no change or worsening in pain, reaction to medication and weakness  Alternatives discussed: alternative treatment, delayed treatment, no treatment and referral  Discussed with patient the purpose of the treatment/procedure, other ways of treating my condition, including no treatment/ procedure and the risks and benefits of the alternatives. Patient has decided to proceed with treatment/procedure.        Universal Protocol:  Relevant documents: relevant documents present and verified  Test results: test results available and properly labeled  Imaging studies: imaging studies available  Required items: required blood products, implants, devices, and special equipment available  Site marked: the operative site was marked  Patient identity confirmed: Patient identify confirmed verbally with patient.        Time out: Immediately prior to procedure a time out was called to verify the correct patient, procedure, equipment, support staff and site/side marked as required      Procedures Details:   Indications: pain   Prep: chlorhexidine  Patient position: prone  Estimated Blood Loss: minimal  Specimens: none  Number of Levels: 1  Approach: midline  Guidance: fluoroscopy  Contrast: Procedure confirmed with contrast under live fluoroscopy.  Needle and Epidural Catheter: tuohy  Needle size: 18 G  Injection procedure: Incremental injection, Negative aspiration for blood and Introduced needle  Patient tolerance: Patient tolerated the procedure well with no immediate complications. Pressure was applied, and hemostasis was accomplished.  Outcome: Pain improved  Comments: 4ml NS and 80mg  depomedrol inj    This patient's clinical history, exam, AND imaging support radiculopathy AND there is a significant impact on quality of life and function AND the pain has been present for at least 4 weeks AND they have failed to improve with noninvasive conservative care.              Estimated blood loss: none or minimal  Specimens: none  Patient tolerated the procedure well with no immediate complications. Pressure was applied, and hemostasis was accomplished.

## 2022-04-05 NOTE — Discharge Instructions - Supplementary Instructions
General Post-Procedure Instructions: Pain Injection      Procedure Completed Today:  Joint Injection (hip, knee, shoulder)  Epidural Steroid Injection (Cervical; Thoracic; Lumbar)  Transforaminal Steroid Injection (Cervical; Lumbar)  Facet Joint Injection  Other: ___________________________    Important information following your procedure today:  You may drive today     If you had sedation, you may NOT drive today  Rest at home for the next 6 hours.  You may then begin to resume your normal activities.  DO NOT drive any vehicle, operate any power tools, drink alcohol, make any major decisions, or sign any legal documents for the next 12 hours.  Pain relief may not be immediate. It is possible you may even experience an increase in pain during the first 24-48 hours followed by a gradual decrease of your pain.  Though the procedure is generally safe, and complications are rare, we do ask that you be aware of any of the following:  Any swelling, persistent redness, new bleeding or drainage from the site of the injection.  You should not experience a severe headache.  You should not run a fever over 101oF.  New onset of sharp, severe back and or neck pain.  New onset of upper or lower extremity numbness or weakness.  New difficulty controlling bowel or bladder function after injection.  New shortness of breath.  If any of these occur, please call to report this occurrence to a nurse at [phone]. If you are calling after 4:00pm, on the weekends or holidays please call [phone] and ask to have the pain management resident physician on call for the physician paged or go to your local emergency room.   You may experience soreness at the injection site. Ice can be applied at 20-minute intervals for the first 24 hours. The following day you may alternate ice with heat if you are experiencing muscle tightness, otherwise continue with ice. Ice works best at decreasing pain. Avoid application of direct heat, hot showers or hot tubs today.  Avoid strenuous activity today. You many resume your regular activities and exercise tomorrow.  Patients with diabetes may see an elevation in blood sugars for 7-10 days after the injection. It is important to pay close attention to your diet, check your blood sugars daily and report extreme elevations to the physician that treats your diabetes.  Patients taking daily blood thinners can resume their regular dose this evening.  It is important that you take all medications ordered by your pain physician. Taking medications as ordered is an important part of your pain care plan. If you cannot continue the medication plan, please notify the physician.    Possible side effects to steroids that may occur:  Flushing or redness of the face  Irritability  Fluid retention  Change in women's menses    Follow up appointment as needed. In the event you are unable to keep an appointment, please notify the scheduler 24 hours in advance at [phone].

## 2022-05-27 ENCOUNTER — Encounter: Admit: 2022-05-27 | Discharge: 2022-05-27 | Payer: MEDICARE

## 2023-06-24 IMAGING — CR [ID]
3 series · 3 of 3 positions shown · non-contrast
Comparison: No relevant prior studies available.

DIAGNOSTIC STUDIES

EXAM:  XR THORACIC SPINE, 2 VIEWS  (18212)
INDICATION: Recent (4mo) fusion, injured today w moderate pain back pain after pts girlfriend
tripped and landed on pt, recent (4MO ago) fusion, pain since tripping incident
TECHNIQUE: 2 views of the thoracic spine.

[tspine ap]
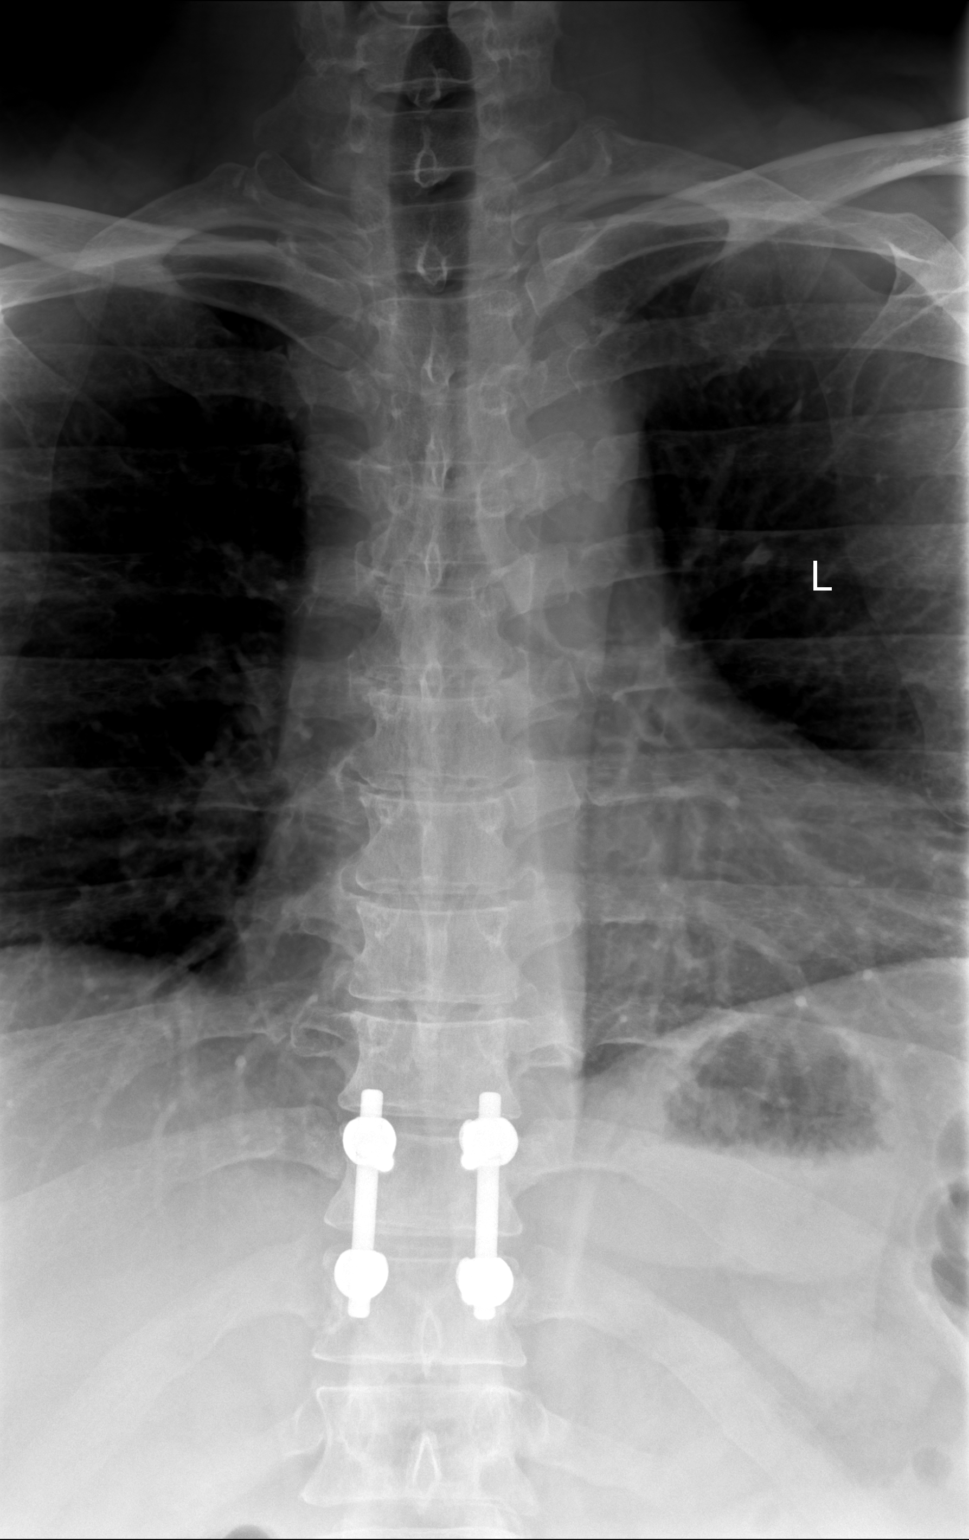

[tspine swimmers (1 of 2)]
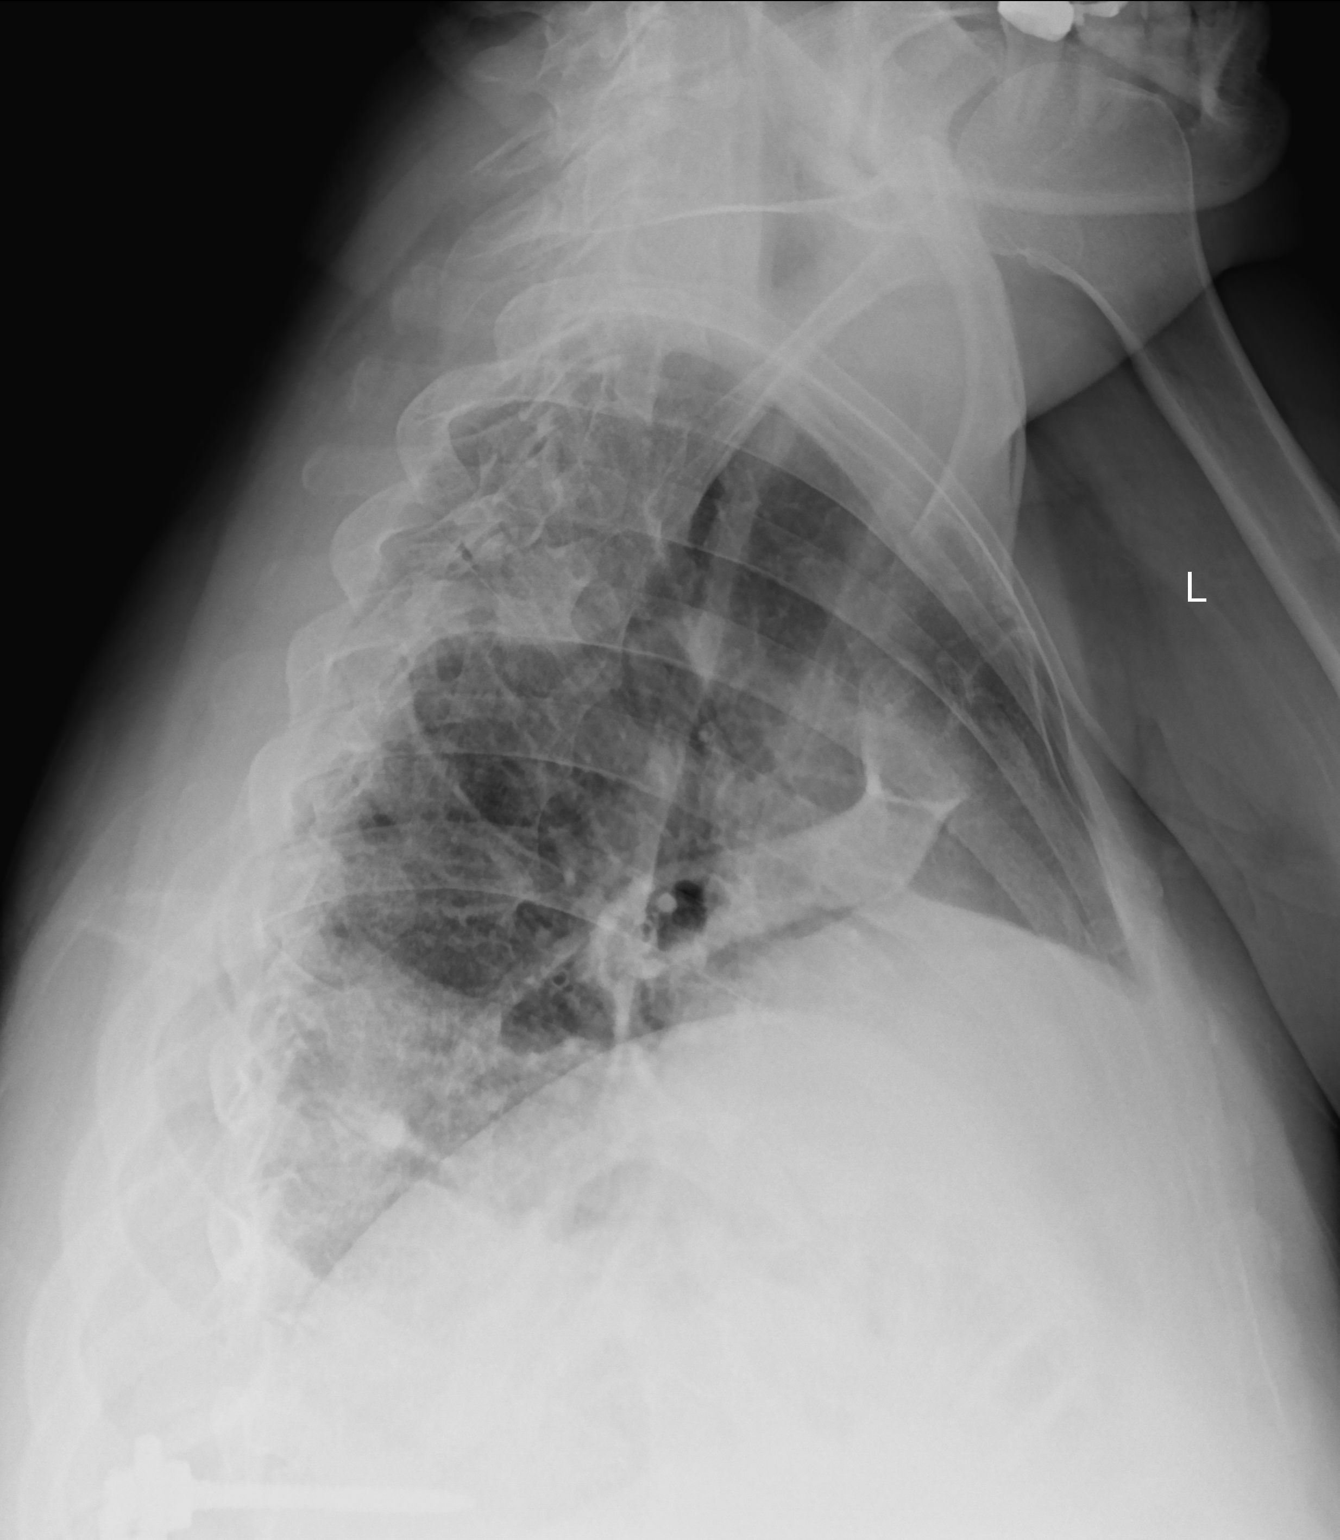

[tspine swimmers (2 of 2)]
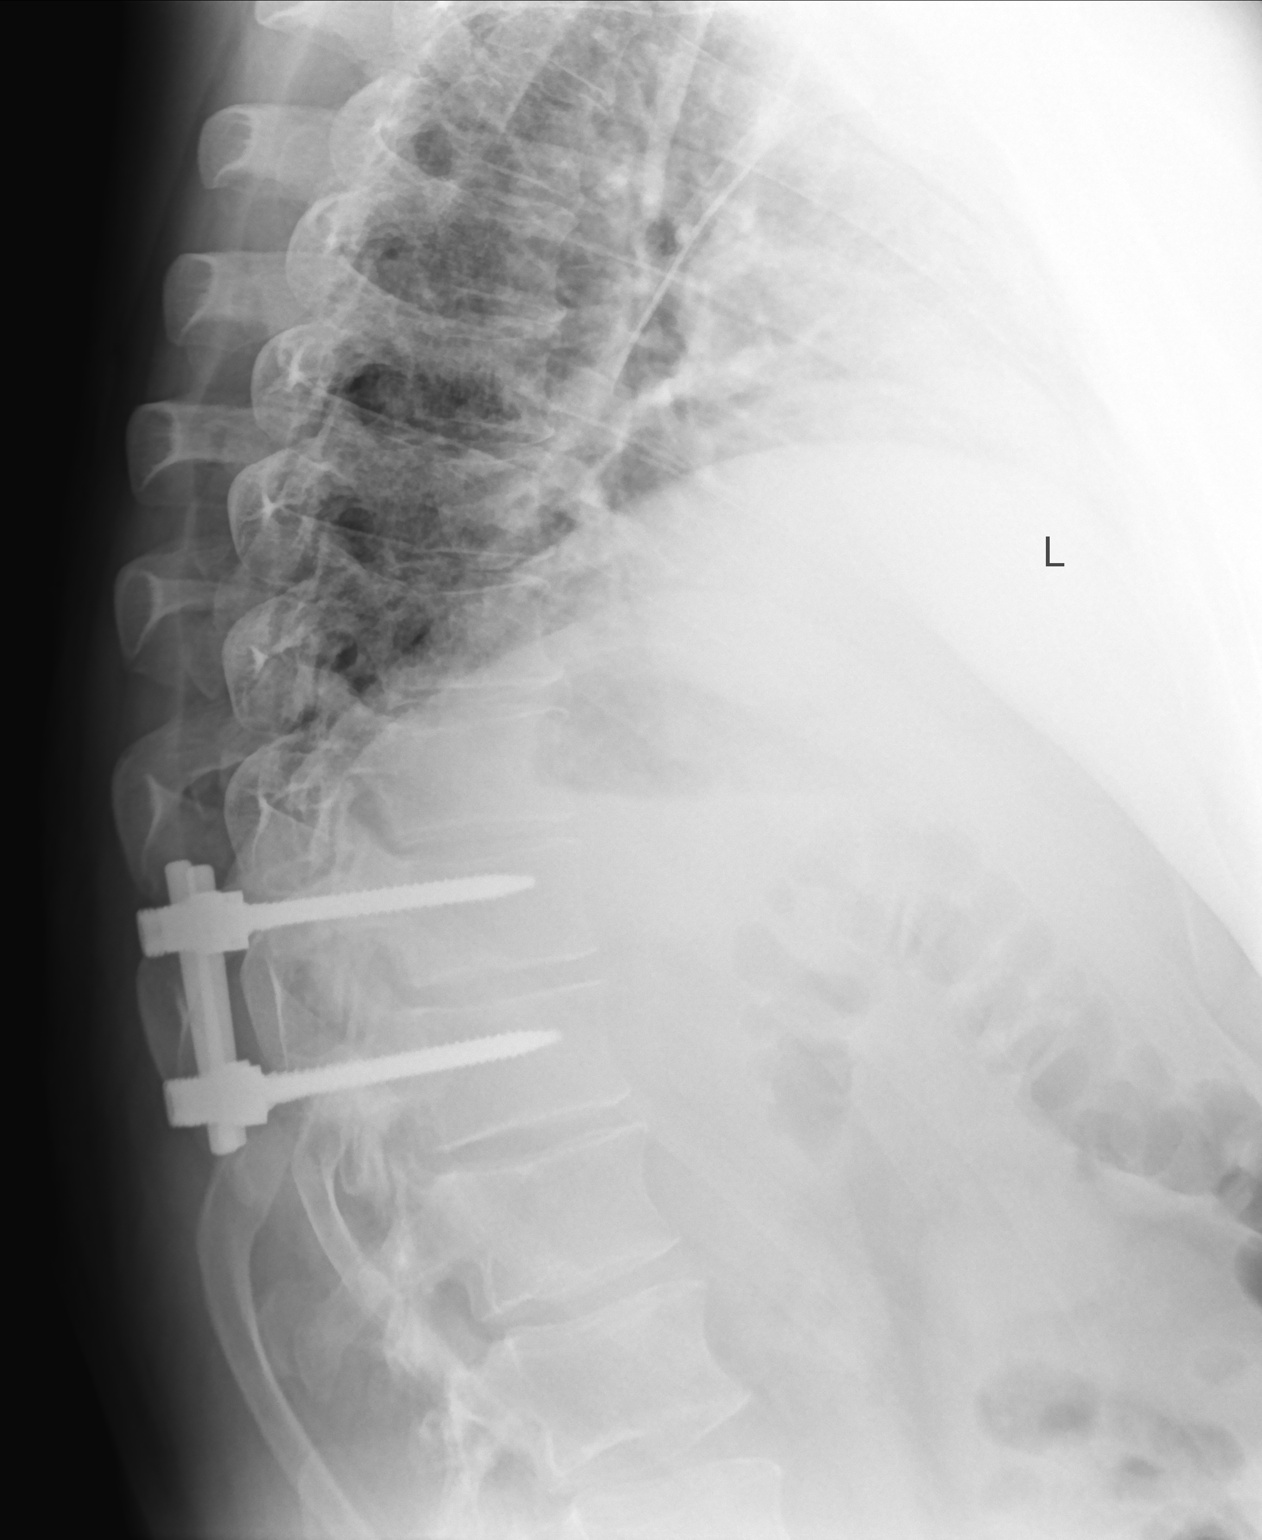

[3 of 3 positions shown; findings below may reference images not displayed]

FINDINGS: VERTEBRAE:  Pedicle screws and rods  at T11 and T12. Hardware is unremarkable as visualized.

Limited evaluation of the mid and upper spine secondary to positioning on the lateral view.

DISC SPACES:  Unremarkable as visualized.

SOFT TISSUES:  NO radiopaque foreign body.
IMPRESSION: - Pedicle screws and rods  at T11 and T12. Hardware is unremarkable as visualized.

- Limited evaluation of the mid and upper spine secondary to positioning on the lateral view.
Repeat if indicated.

Tech Notes:

back pain after pts girlfriend tripped and landed on pt, recent (4MO ago) fusion, pain since
tripping incident

## 2023-09-20 ENCOUNTER — Encounter: Admit: 2023-09-20 | Discharge: 2023-09-20 | Payer: MEDICARE

## 2023-09-21 ENCOUNTER — Encounter: Admit: 2023-09-21 | Discharge: 2023-09-21 | Payer: MEDICARE

## 2023-09-22 ENCOUNTER — Encounter: Admit: 2023-09-22 | Discharge: 2023-09-22 | Payer: MEDICARE

## 2023-09-26 ENCOUNTER — Encounter: Admit: 2023-09-26 | Discharge: 2023-09-26 | Payer: MEDICARE

## 2023-11-25 ENCOUNTER — Encounter: Admit: 2023-11-25 | Discharge: 2023-11-25 | Payer: MEDICARE

## 2024-01-04 ENCOUNTER — Encounter: Admit: 2024-01-04 | Discharge: 2024-01-04 | Payer: MEDICARE

## 2024-01-04 ENCOUNTER — Ambulatory Visit: Admit: 2024-01-04 | Discharge: 2024-01-05 | Payer: MEDICARE

## 2024-01-04 NOTE — Progress Notes
 [facility] CLINIC NOTE     Chronic back pain and leg pain  Patient was last seen in June 2023 for injections  Chronic low back pain since 2015  Rt leg pain started since 2020  Patient has undergone surgical treatment with Dr. Earlene Plater in neurosurgery with partial laminectomy at L3 in 2021, did not relieve leg pain    Had relief for a month but helped some  Did not repeat soon due to copay  Low back pain that goes down to Rt posterior thigh and leg  Pain is constant sharp and stabbing   Only pain meds makes it better. His PCP managing his pain medicine  Pain is worse with movement, prolonged sitting and walking  He has done PT but did not help, doing HEP and stretches usually in the morning for 2 years  Rt leg feels weaker and will give out sometimes  Trying to schedule injection but his PCP wants him to inquire about pain pump  Wants to know if he will be a candidate, if not he wants to repeat injection now that he has new insurance            Current Outpatient Medications:     acetaminophen (TYLENOL EXTRA STRENGTH) 500 mg tablet, Take two tablets by mouth every 6 hours as needed for Pain. Max of 4,000 mg of acetaminophen in 24 hours.  Indications: pain, Disp: , Rfl: 0    busPIRone (BUSPAR) 10 mg tablet, Take two tablets by mouth twice daily., Disp: , Rfl:     calcium carbonate (TUMS) 500 mg (200 mg elemental calcium) chewable tablet, Chew one tablet by mouth daily as needed. Indications: heartburn, Disp: , Rfl:     diazePAM (VALIUM) 5 mg tablet, Take one tablet by mouth twice daily as needed for Anxiety., Disp: , Rfl:     duloxetine DR (CYMBALTA) 60 mg capsule, Take one capsule by mouth twice daily., Disp: , Rfl:     gabapentin (NEURONTIN) 300 mg capsule, Take two capsules by mouth three times daily., Disp: , Rfl:     HYDROcodone/acetaminophen (NORCO) 10/325 mg tablet, Take one tablet by mouth every 6 hours as needed for Pain., Disp: , Rfl:     lidocaine (LIDODERM) 5 % topical patch, Apply one patch topically to affected area daily. Apply patch for 12 hours, then remove for 12 hours before repeating., Disp: 90 patch, Rfl: 0    methocarbamoL (ROBAXIN) 500 mg tablet, Take two tablets by mouth four times daily as needed for Muscle Cramps or Spasms., Disp: 65 tablet, Rfl: 0    metoprolol XL (TOPROL XL) 25 mg extended release tablet, Take one tablet by mouth daily., Disp: , Rfl:     NARCAN 4 mg/actuation nasal spray, ADMINISTER A SINGLE SPRAY INTRANASALLY INTO ONE NOSTRIL CALL 911 MAY REPEAT ONE TIME, Disp: , Rfl:     OXcarbazepine (TRILEPTAL) 300 mg tablet, one tablet twice daily., Disp: , Rfl:     oxyCODONE 10 mg tablet, Take one tablet by mouth twice daily as needed for Pain. Do not start before March 18, 2021.  May resume once oxycodone taper dosing completed., Disp: , Rfl: 0    senna/docusate (SENOKOT-S) 8.6/50 mg tablet, Take one tablet by mouth twice daily as needed. Indications: constipation, Disp: 90 tablet, Rfl:     sildenafiL (VIAGRA) 50 mg tablet, Take one tablet by mouth as Needed., Disp: , Rfl:     vitamins, multiple tablet, Take one tablet by mouth daily., Disp: , Rfl:  No Known Allergies  Physical Exam  Vitals:    01/04/24 1351   BP Source: Arm, Left Upper   Pulse: (P) 95   SpO2: (P) 100%   PainSc: Seven   Weight: 99.8 kg (220 lb)   Height: 172.7 cm (5' 8)     Oswestry Total Score:: (Patient-Rptd) 68  Pain Score: Seven  Body mass index is 33.45 kg/m?Marland Kitchen    General: Alert, oriented, mild distress  HEENT: normocephalic  Resp: Non labored breathing, no distress  Cardio: Pedal pulses palpable bilaterally, mild lower extremity edema  MS: TTP in lumbar region of spine and paraspinal muscles.  NEURO: Cranial nerves II-XII intact. Motor strength adequate, sensory exams normal. Gait antalgic.  Behavior: Calm, cooperative, behavior and speech normal.           IMPRESSION:  1. Lumbar stenosis with neurogenic claudication    2. Lumbar radiculopathy            PLAN:   Plan for TFESI bilat L5-S1  Discussed ordering new imaging if he will not get relief from this injection, last imaging was in 2021  Also discussed that he will not be a candidate for pain pump but we may consider alternative interventional therapy such as stimulator implant, however he is not interested at this time  He will continue conservative measures like HEP   Follow-up after TFESI

## 2024-01-05 DIAGNOSIS — M5416 Radiculopathy, lumbar region: Secondary | ICD-10-CM

## 2024-01-05 DIAGNOSIS — M48062 Spinal stenosis, lumbar region with neurogenic claudication: Secondary | ICD-10-CM

## 2024-01-19 ENCOUNTER — Encounter: Admit: 2024-01-19 | Discharge: 2024-01-19

## 2024-01-23 ENCOUNTER — Encounter: Admit: 2024-01-23 | Discharge: 2024-01-23

## 2024-02-02 ENCOUNTER — Encounter: Admit: 2024-02-02 | Discharge: 2024-02-02 | Payer: MEDICARE

## 2024-02-02 DIAGNOSIS — M48062 Spinal stenosis, lumbar region with neurogenic claudication: Secondary | ICD-10-CM

## 2024-02-02 DIAGNOSIS — M5416 Radiculopathy, lumbar region: Secondary | ICD-10-CM

## 2024-02-09 ENCOUNTER — Encounter: Admit: 2024-02-09 | Discharge: 2024-02-09 | Payer: MEDICARE

## 2024-02-09 ENCOUNTER — Ambulatory Visit: Admit: 2024-02-09 | Discharge: 2024-02-10 | Payer: MEDICARE

## 2024-02-09 ENCOUNTER — Ambulatory Visit: Admit: 2024-02-09 | Discharge: 2024-02-09 | Payer: MEDICARE

## 2024-02-09 DIAGNOSIS — M5416 Radiculopathy, lumbar region: Secondary | ICD-10-CM

## 2024-02-09 DIAGNOSIS — M48062 Spinal stenosis, lumbar region with neurogenic claudication: Secondary | ICD-10-CM

## 2024-02-09 MED ORDER — IOHEXOL 240 MG IODINE/ML IV SOLN
1 mL | Freq: Once | EPIDURAL | 0 refills | Status: CP
Start: 2024-02-09 — End: ?
  Administered 2024-02-09: 16:00:00 1 mL via EPIDURAL

## 2024-02-09 MED ORDER — METHYLPREDNISOLONE ACETATE 80 MG/ML IJ SUSP
80 mg | Freq: Once | INTRAMUSCULAR | 0 refills | Status: CP
Start: 2024-02-09 — End: ?
  Administered 2024-02-09: 16:00:00 80 mg via INTRAMUSCULAR

## 2024-02-09 NOTE — Progress Notes

## 2024-02-09 NOTE — Progress Notes
 SPINE CENTER  INTERVENTIONAL PAIN PROCEDURE HISTORY AND PHYSICAL    Chief Complaint: Pain    HISTORY OF PRESENT ILLNESS:  pain    Past Medical History:    Allergies    Anxiety    Arthritis    Degenerative disc disease, cervical    Degenerative disc disease, lumbar    Degenerative disc disease, thoracic    Depression    Generalized headaches    Hypertension    Joint pain    Spinal stenosis    Tachycardia       Surgical History:   Procedure Laterality Date    LAMINECTOMY  12/08/2018    MINIMALLY INVASIVE LUMBAR 3 LAMINECTOMY N/A 12/08/2018    Performed by Jac Martes, MD at [facility] OR    Thoracic 10-11 laminectomy with instrumented fusion N/A 03/10/2021    Performed by Jac Martes, MD at [facility] OR    FUSION SPINE POSTERIOR - THORACIC N/A 03/10/2021    Performed by Jac Martes, MD at [facility] OR    POSTERIOR NON-SEGMENTAL INSTRUMENTATION SPINE N/A 03/10/2021    Performed by Jac Martes, MD at [facility] OR    ROBOT ASSISTED STEREOTACTIC COMPUTER ASSISTED PROCEDURE-SPINAL N/A 03/10/2021    Performed by Jac Martes, MD at [facility]    WISDOM TEETH EXTRACTION         family history includes Arthritis in his father, father, mother, sister, and sister; Back pain in his brother, brother, father, father, mother, sister, and sister; Cancer in his maternal grandmother, maternal grandmother, and mother; Heart problem in his daughter and daughter; Hypertension in his father and father; Joint Pain in his mother; Neck Pain in his mother; Osteoporosis in his mother; Scoliosis in his brother, brother, sister, and sister.    Social History     Socioeconomic History    Marital status: Married   Tobacco Use    Smoking status: Former     Types: Cigars    Smokeless tobacco: Never    Tobacco comments:     cigars/cigarretts socially, not daily   Vaping Use    Vaping status: Never Used   Substance and Sexual Activity    Alcohol use: Yes     Alcohol/week: 4.0 standard drinks of alcohol     Types: 4 Cans of beer per week     Comment: daily    Drug use: Never    Sexual activity: Yes     Partners: Female     Birth control/protection: None       No Known Allergies    Vitals:    02/09/24 1000   BP: (!) 139/99   BP Source: Arm, Left Upper   Pulse: 120   Temp: 36.9 ?C (98.4 ?F)   Resp: 14   SpO2: 100%   TempSrc: Oral   PainSc: Seven   Weight: 102.1 kg (225 lb)   Height: 172.7 cm (5' 8)     Pain Score: Seven  Oswestry Total Score:: (Patient-Rptd) 72    REVIEW OF SYSTEMS: 10 point ROS obtained and negative except pain      PHYSICAL EXAM:unchnaged        IMPRESSION:    1. Lumbar stenosis with neurogenic claudication    2. Lumbar radiculopathy         PLAN: Lumbar Transforaminal Steroid Injection

## 2024-02-09 NOTE — Progress Notes
 Pt kept in recovery bay longer than typical after procedure due to right leg weakness/numbness. Reassessed at 1115 and still has right leg instability; will continue to monitor.

## 2024-02-09 NOTE — Patient Instructions
 Procedure Completed Today: Lumbar Transforaminal Steroid Injection    Important information following your procedure today: You may drive today    Pain relief may not be immediate. It is possible you may even experience an increase in pain during the first 24-48 hours followed by a gradual decrease of your pain.  Though the procedure is generally safe and complications are rare, we do ask that you be aware of any of the following:   Any swelling, persistent redness, new bleeding, or drainage from the site of the injection.  You should not experience a severe headache.  You should not run a fever over 101? F.  New onset of sharp, severe back & or neck pain.  New onset of upper or lower extremity numbness or weakness.  New difficulty controlling bowel or bladder function after the injection.  New shortness of breath.    If any of these occur, please call to report this occurrence to Dr. Welton Flakes at [phone]. If you are calling after 4:00 p.m., on weekends or holidays please call [phone] and ask to have the resident physician on call for the physician paged or go to your local emergency room.  You may experience soreness at the injection site. Ice can be applied at 20 minute intervals. Avoid application of direct heat, hot showers or hot tubs today.  Avoid strenuous activity today. You may resume your regular activities and exercise tomorrow.  Patients with diabetes may see an elevation in blood sugars for 7-10 days after the injection. It is important to pay close attention to your diet, check your blood sugars daily and report extreme elevations to the physician that treats your diabetes.  Patients taking a daily blood thinner can resume their regular dose this evening.  It is important that you take all medications ordered by your pain physician. Taking medication as ordered is an important part of your pain care plan. If you cannot continue the medication plan, please notify the physician.     Possible side effects to steroids that may occur:  Flushing or redness of the face  Irritability  Fluid retention  Change in women?s menses    The following medications were used: Lidocaine , Depomedrol, and Contrast Dye

## 2024-02-09 NOTE — Procedures
 Attending Surgeon: Cordie Deters, MD    Anesthesia: Local      [facility] TRANSFORAMINAL LUMBAR/SACRAL THERAPEUTIC  Procedure: transforaminal epidural    Laterality: bilateral   on 02/09/2024 11:30 AM  Location: lumbar - L5-S1      Consent:   Consent obtained: written  Consent given by: patient    Discussed with patient the purpose of the treatment/procedure, other ways of treating my condition, including no treatment/ procedure and the risks and benefits of the alternatives. Patient has decided to proceed with treatment/procedure.   Procedures Details:   Indications: pain   Prep: chlorhexidine  Patient position: prone  Estimated Blood Loss: minimal  Specimens: none  Number of Levels: 1  Approach: left paramedian  Guidance: fluoroscopy  Contrast: Procedure confirmed with contrast under live fluoroscopy.  Needle and Epidural Catheter: pencil-tip  Needle size: 25 G  Injection procedure: Incremental injection, Negative aspiration for blood and Introduced needle  Patient tolerance: Patient tolerated the procedure well with no immediate complications. Pressure was applied, and hemostasis was accomplished.  Outcome: Pain relieved and Pain improved  Comments: 1ml lido and 40mg  depomedrol inj bilaterally    This patient's clinical history, exam, AND imaging support radiculopathy AND there is a significant impact on quality of life and function AND the pain has been present for at least 4 weeks AND they have failed to improve with noninvasive conservative care.        Estimated blood loss: none or minimal  Specimens: none  Patient tolerated the procedure well with no immediate complications. Pressure was applied, and hemostasis was accomplished.

## 2024-05-25 ENCOUNTER — Encounter: Admit: 2024-05-25 | Discharge: 2024-05-25 | Payer: MEDICARE

## 2024-05-25 NOTE — Telephone Encounter
 8/8 - Records request faxed to [facility], Dr. Norleen Creeks, (F) [phone] ([phone].  Thanks Kindly!  Luke BECKER  HIM Specialist - Cardiovascular Medicine  [facility]  [address], [address]  [place], [place]  [address]  [phone]

## 2024-06-16 ENCOUNTER — Encounter: Admit: 2024-06-16 | Discharge: 2024-06-16 | Payer: MEDICARE

## 2024-06-16 NOTE — Progress Notes
 CARDIAC NEW PATIENT PROFILE      PHYSICIANS INFORMATION:   REFERRING PHYSICIAN: Naomia Norleen SAUNDERS, MD    PCP: Naomia Norleen SAUNDERS, MD    REASON FOR VISIT/DIAGNOSIS:   Hyperlipidemia   Tachycardia     RECENT EVENTS/SYMPTOMS:     04/04/2024 PCP OV NOTE  HPI:  Troy Matthews is a 48 year old male here today for medicare wellness.     ASSESSMENT AND PLAN:  Encounter for annual wellness exam in Medicare patient  Tachycardia  Dyslipidemia  Hyperlipidemia    Referral to [facility], And referral to Dr. Kyung for his first screening colonoscopy. Labs were reviewed and are fine. He is not needing any vaccinations and he is current on all his other screenings. F/u in the fall. Referral to cardiology.    PERTINENT CARDIAC HISTORY:   Tachycardia  Dyslipidemia  Hyperlipidemia    OTHER MEDICAL HISTORY:   Anxiety  Laminectomy  Back pain    MOST RECENT PERTINENT TESTING/PROCEDURES:     LAB:       Component  Ref Range & Units (hover) 11/22/23   Cholesterol 291 Abnormal    Triglycerides 551 Abnormal    HDL 35 Abnormal    LDL    VLDL 110 Abnormal    Non HDL Cholesterol    Cholesterol/HDL Ratio 8 Abnormal    Resulting Agency OTHER        RECORDS: bookmarked    IMAGES: 01/28/2021 EKG

## 2024-06-21 ENCOUNTER — Ambulatory Visit: Admit: 2024-06-21 | Discharge: 2024-06-21 | Payer: MEDICARE

## 2024-06-21 ENCOUNTER — Encounter: Admit: 2024-06-21 | Discharge: 2024-06-21 | Payer: MEDICARE

## 2024-06-21 DIAGNOSIS — Z136 Encounter for screening for cardiovascular disorders: Principal | ICD-10-CM

## 2024-06-21 DIAGNOSIS — R000 Tachycardia, unspecified: Secondary | ICD-10-CM

## 2024-06-21 DIAGNOSIS — R0609 Other forms of dyspnea: Secondary | ICD-10-CM

## 2024-06-21 NOTE — Progress Notes
 Date of Service: 06/21/2024    Troy Matthews is a 48 y.o. male.       HPI   Troy Matthews presents today for chronic dyspnea with exertion.  He reports dyspnea with walking less than 100 yards.  This has been present for years and has not worsened over the past year.  He is a prior cigarette smoker but has not smoked over the past 10 years.  Troy Matthews reports that his heart rate tends to be elevated and he has taken metoprolol  for several years to help control his heart rate.  He is not aware of any resting tachycardia but reports that his heart rate tends to increase with activity.  I also noticed that his triglycerides were significantly elevated when checked in February 2025.  Troy Matthews reports that he has been disabled for several years due to chronic back discomfort.  He reports 2 prior back surgeries.  The patient reports occasional mild lightheadedness with standing.  Troy Matthews reports no angina, congestive symptoms, palpitations, sensation of paroxysmal sustained forceful heart pounding, presyncope or syncope.  His exercise tolerance is limited by his chronic back disease.  The patient reports no myalgias, bleeding abnormalities, claudication or strokelike symptoms.       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   HENT: Negative.     Eyes: Negative.    Cardiovascular: Negative.    Respiratory: Negative.     Endocrine: Negative. Hematologic/Lymphatic: Negative.    Skin: Negative.    Musculoskeletal: Negative.    Gastrointestinal: Negative.    Genitourinary: Negative.    Neurological: Negative.    Psychiatric/Behavioral: Negative.     Allergic/Immunologic: Negative.      Physical Exam  GENERAL: The patient is well developed, well nourished, resting comfortably and in no distress.   HEENT: No abnormalities of the visible oro-nasopharynx, conjunctiva or sclera are noted.  NECK: There is no jugular venous distension. Carotids are palpable and without bruits. There is no thyroid enlargement.  Chest: Lung fields are clear to auscultation. There are no wheezes or crackles.  CV: There is a regular rhythm. The first and second heart sounds are normal. There are no murmurs, gallops or rubs.  ABD: The abdomen is soft and supple with normal bowel sounds. There is no hepatosplenomegaly, ascites, tenderness, masses or bruits.  Neuro: There are no focal motor defects. Ambulation is normal. Cognitive function appears normal.  Ext: There is no edema or evidence of deep vein thrombosis. Peripheral pulses are satisfactory.    SKIN: There are no rashes and no cellulitis  PSYCH: The patient is calm, rationale and oriented.    Cardiovascular Studies  A twelve-lead ECG was obtained on 06/21/2024 reveals normal sinus rhythm with a  heart rate of 88 bpm.  Incomplete right bundle branch block is noted with QRS duration = 106 ms.    Cardiovascular Health Factors  Vitals BP Readings from Last 3 Encounters:   06/21/24 (!) 136/99   02/09/24 (!) 134/94   04/05/22 (!) 143/99     Wt Readings from Last 3 Encounters:   06/21/24 96 kg (211 lb 9.6 oz)   02/09/24 102.1 kg (225 lb)   01/04/24 99.8 kg (220 lb)     BMI Readings from Last 3 Encounters:   06/21/24 31.25 kg/m?   02/09/24 34.21 kg/m?   01/04/24 33.45 kg/m?      Smoking Tobacco Use History[1]   Lipid Profile Cholesterol   Date Value Ref Range Status   11/22/2023 291 (A)  Final     HDL   Date Value Ref Range Status 11/22/2023 35 (A)  Final     No results found for: LDL  Triglycerides   Date Value Ref Range Status   11/22/2023 551 (A)  Final      Blood Sugar No results found for: HGBA1C  Glucose   Date Value Ref Range Status   11/22/2023 132 (A)  Final   02/17/2021 79 70 - 100 MG/DL Final   95/86/7977 889 (H) 70 - 100 MG/DL Final          Problems Addressed Today  Encounter Diagnoses   Name Primary?    Screening for heart disease Yes    Tachycardia     Dyspnea on exertion        Assessment and Plan   1) I have asked Troy Matthews to wear a 7-day Zio patch to assess his heart rate and to evaluate for any significant ectopy or arrhythmias.  2) for further evaluation of his chronic dyspnea with exertion I have asked him to obtain an echo Doppler study to assess for structural cardiac abnormalities as well as a regadenoson thallium stress test to assess for any objective evidence of myocardial ischemia.  He cannot walk on a treadmill because of his chronic back disease.  3) I spent a long period of time counseling Troy Matthews about his diet.  Restriction of carbohydrates and saturated fats was strongly reinforced.  A Dash or Mediterranean style diet was recommended.  In an effort to lower his triglycerides, regular activity as tolerated by his back discomfort was strongly recommended.  He believes that he may be able to do water exercises without much back discomfort.  I have asked him to recheck his fasting lipid profile after he implements dietary restrictions and tries to increase his activity.  4) I have asked him to monitor his blood pressure and work with his primary care physician for monitoring and control of hypertension.  5) cardiovascular risk factor management was reviewed in detail.  I have asked him to return for follow-up in approximately 4 months. The total time spent during this interview and exam with preparation and chart review was 60 minutes.             Current Medications (including today's revisions) acetaminophen  (TYLENOL  EXTRA STRENGTH) 500 mg tablet Take two tablets by mouth every 6 hours as needed for Pain. Max of 4,000 mg of acetaminophen  in 24 hours.  Indications: pain (Patient not taking: Reported on 06/21/2024)    citalopram (CELEXA) 20 mg tablet Take two tablets by mouth daily.    duloxetine  DR (CYMBALTA ) 60 mg capsule Take one capsule by mouth twice daily.    ergocalciferol (vitamin  D2) (VITAMIN D PO) Take  by mouth.    ezetimibe (ZETIA) 10 mg tablet Take one tablet by mouth daily.    gabapentin  (NEURONTIN ) 300 mg capsule Take two capsules by mouth three times daily. (Patient taking differently: Take three capsules by mouth three times daily.)    HYDROcodone /acetaminophen  (NORCO) 10/325 mg tablet Take one tablet by mouth every 6 hours as needed for Pain.    LORazepam (ATIVAN) 1 mg tablet Take one tablet by mouth every 6 hours as needed.    metoprolol  succinate XL (TOPROL  XL) 50 mg extended release tablet Take one tablet by mouth daily.    sildenafiL (VIAGRA) 50 mg tablet Take one tablet by mouth as Needed. (Patient not taking: Reported on 06/21/2024)    vitamins, multiple tablet Take one tablet by mouth daily. (Patient not taking: Reported on 06/21/2024)                 [1]   Social History  Tobacco Use   Smoking Status Former    Types: Cigars   Smokeless Tobacco Never   Tobacco Comments    cigars/cigarretts socially, not daily

## 2024-06-21 NOTE — Progress Notes
 Ambulatory (External) Cardiac Monitor Enrollment Record     Placement Location: Home Enrollment  Vendor: [employer] ([employer])  Mobile Cardiac Telemetry (MCOT/MCT)?: No  Duration of Monitor (in days): 7  Monitor Diagnosis: Tachycardia (R00.0)  No data recordedOrdering Provider: Debroah Standing  AMB Monitor Serial Number: home  No data recorded    Start Time and Date: 06/21/24 2:17 PM   Patient Name: Troy Matthews  DOB: [birth year]  MRN: [id number]  Sex: male  Mobile Phone Number: [phone] (mobile)  Home Phone Number: [phone]  Patient Address: [address] [place] [address]  Insurance Coverage: [employer]  Insurance ID: [id number]  Insurance Group #: [id number]  Insurance Subscriber: [name]  Implanted Cardiac Device Information: No results found for: EPDEVTYP      Patient instructed to contact company phone number on the monitor box with questions regarding billing, placement, troubleshooting.     Chiquita Obelia Bonello    ____________________________________________________________    Clinic Staff:    Complete additional steps for documentation double check/Co-Sign.  In Follow-up, send chart upon closing encounter to [facility] AMBULATORY MONITORS    [facility] Ambulatory Monitoring Team:  Schedule on appropriate template and check-in.   Clinic Placement Schedule on clinic location [facility] schedule   Home Enrollment Schedule on Home Enrollment schedule ([facility])   Given to patient in clinic for self-placement Schedule on Home Enrollment schedule ([facility])   Inpatient Schedule on [place] [facility] AMBULATORY MONITORING template   2. Please enroll with appropriate vendor.

## 2024-07-06 ENCOUNTER — Encounter: Admit: 2024-07-06 | Discharge: 2024-07-06 | Payer: MEDICARE

## 2024-08-06 ENCOUNTER — Encounter: Admit: 2024-08-06 | Discharge: 2024-08-06 | Payer: MEDICARE

## 2024-08-06 NOTE — Telephone Encounter
 Reviewed stress inst with pt emphasized npo and no caffeine

## 2024-08-08 ENCOUNTER — Encounter: Admit: 2024-08-08 | Discharge: 2024-08-08 | Payer: MEDICARE

## 2024-08-09 ENCOUNTER — Encounter: Admit: 2024-08-09 | Discharge: 2024-08-09 | Payer: MEDICARE

## 2024-08-09 DIAGNOSIS — M5416 Radiculopathy, lumbar region: Secondary | ICD-10-CM

## 2024-08-09 DIAGNOSIS — M48062 Spinal stenosis, lumbar region with neurogenic claudication: Principal | ICD-10-CM

## 2024-08-23 ENCOUNTER — Encounter: Admit: 2024-08-23 | Discharge: 2024-08-23 | Payer: MEDICARE

## 2024-08-30 ENCOUNTER — Encounter: Admit: 2024-08-30 | Discharge: 2024-08-30 | Payer: MEDICARE

## 2024-08-30 ENCOUNTER — Ambulatory Visit: Admit: 2024-08-30 | Discharge: 2024-08-30 | Payer: MEDICARE

## 2024-08-30 ENCOUNTER — Ambulatory Visit: Admit: 2024-08-30 | Discharge: 2024-08-31 | Payer: MEDICARE

## 2024-08-30 VITALS — BP 127/85 | HR 98 | Temp 98.10000°F | Resp 18 | Ht 68.0 in | Wt 205.0 lb

## 2024-08-30 DIAGNOSIS — M5416 Radiculopathy, lumbar region: Secondary | ICD-10-CM

## 2024-08-30 DIAGNOSIS — M48062 Spinal stenosis, lumbar region with neurogenic claudication: Principal | ICD-10-CM

## 2024-08-30 MED ORDER — METHYLPREDNISOLONE ACETATE 80 MG/ML IJ SUSP
80 mg | Freq: Once | INTRAMUSCULAR | 0 refills | Status: CP
Start: 2024-08-30 — End: ?
  Administered 2024-08-30: 21:00:00 80 mg via INTRAMUSCULAR

## 2024-08-30 MED ORDER — BUPIVACAINE (PF) 0.25 % (2.5 MG/ML) IJ SOLN
2 mL | Freq: Once | INTRAMUSCULAR | 0 refills | Status: CP
Start: 2024-08-30 — End: ?
  Administered 2024-08-30: 21:00:00 2 mL via INTRAMUSCULAR

## 2024-08-30 MED ORDER — IOHEXOL 240 MG IODINE/ML IV SOLN
1 mL | Freq: Once | EPIDURAL | 0 refills | Status: CP
Start: 2024-08-30 — End: ?
  Administered 2024-08-30: 21:00:00 1 mL via EPIDURAL

## 2024-08-30 NOTE — Progress Notes [1]

## 2024-08-30 NOTE — Patient Instructions [37]
Procedure Completed Today: Lumbar Transforaminal Steroid Injection    Important information following your procedure today: You may drive today    Pain relief may not be immediate. It is possible you may even experience an increase in pain during the first 24-48 hours followed by a gradual decrease of your pain.  Though the procedure is generally safe and complications are rare, we do ask that you be aware of any of the following:   Any swelling, persistent redness, new bleeding, or drainage from the site of the injection.  You should not experience a severe headache.  You should not run a fever over 101? F.  New onset of sharp, severe back & or neck pain.  New onset of upper or lower extremity numbness or weakness.  New difficulty controlling bowel or bladder function after the injection.  New shortness of breath.    If any of these occur, please call to report this occurrence to Dr. Welton Flakes at [phone]. If you are calling after 4:00 p.m., on weekends or holidays please call [phone] and ask to have the resident physician on call for the physician paged or go to your local emergency room.  You may experience soreness at the injection site. Ice can be applied at 20 minute intervals. Avoid application of direct heat, hot showers or hot tubs today.  Avoid strenuous activity today. You may resume your regular activities and exercise tomorrow.  Patients with diabetes may see an elevation in blood sugars for 7-10 days after the injection. It is important to pay close attention to your diet, check your blood sugars daily and report extreme elevations to the physician that treats your diabetes.  Patients taking a daily blood thinner can resume their regular dose this evening.  It is important that you take all medications ordered by your pain physician. Taking medication as ordered is an important part of your pain care plan. If you cannot continue the medication plan, please notify the physician.     Possible side effects to steroids that may occur:  Flushing or redness of the face  Irritability  Fluid retention  Change in women?s menses    The following medications were used: Bupivicaine  , Depomedrol, and Contrast Dye

## 2024-08-30 NOTE — Progress Notes [1]
 SPINE CENTER  INTERVENTIONAL PAIN PROCEDURE HISTORY AND PHYSICAL    Chief Complaint: Pain    HISTORY OF PRESENT ILLNESS:  pain    Past Medical History:    Allergies    Anxiety    Arthritis    Degenerative disc disease, cervical    Degenerative disc disease, lumbar    Degenerative disc disease, thoracic    Depression    Generalized headaches    Hypertension    Joint pain    Spinal stenosis    Tachycardia       Surgical History:   Procedure Laterality Date    LAMINECTOMY  12/08/2018    MINIMALLY INVASIVE LUMBAR 3 LAMINECTOMY N/A 12/08/2018    Performed by Nicholaus Eva DASEN, MD at [facility] OR    Thoracic 10-11 laminectomy with instrumented fusion N/A 03/10/2021    Performed by Nicholaus Eva DASEN, MD at [facility] OR    FUSION SPINE POSTERIOR - THORACIC N/A 03/10/2021    Performed by Nicholaus Eva DASEN, MD at [facility] OR    POSTERIOR NON-SEGMENTAL INSTRUMENTATION SPINE N/A 03/10/2021    Performed by Nicholaus Eva DASEN, MD at [facility] OR    ROBOT ASSISTED STEREOTACTIC COMPUTER ASSISTED PROCEDURE-SPINAL N/A 03/10/2021    Performed by Nicholaus Eva DASEN, MD at [facility] OR    SURGERY      WISDOM TEETH EXTRACTION         family history includes Arthritis in Bridger's father, father, mother, sister, and sister; Back pain in Devonne's brother, brother, father, father, mother, sister, and sister; Cancer in Wendy's maternal grandmother, maternal grandmother, and mother; Heart problem in [place]'s daughter and daughter; Hypertension in [place] father and father; Joint Pain in [place] mother; Neck Pain in [place] mother; Osteoporosis in [place] mother; Scoliosis in [place] brother, brother, sister, and sister.    Social History     Socioeconomic History    Marital status: Married   Tobacco Use    Smoking status: Former     Types: Cigars    Smokeless tobacco: Never    Tobacco comments:     cigars/cigarretts socially, not daily   Vaping Use    Vaping status: Never Used   Substance and Sexual Activity    Alcohol use: Yes     Alcohol/week: 4.0 standard drinks of alcohol     Types: 4 Cans of beer per week     Comment: daily    Drug use: Never    Sexual activity: Yes     Partners: Female     Birth control/protection: None       Allergies[1]    Vitals:    08/30/24 1417   BP Source: Arm, Right Upper   Temp: 36.7 ?C (98.1 ?F)   Resp: 18   TempSrc: Oral   PainSc: Eight   Weight: 93 kg (205 lb)   Height: 172.7 cm (5' 8)     Pain Score: Eight  Oswestry Total Score:: (Patient-Rptd) 62    REVIEW OF SYSTEMS: 10 point ROS obtained and negative except pain      PHYSICAL EXAM:unchnaged        IMPRESSION:    1. Lumbar stenosis with neurogenic claudication    2. Lumbar radiculopathy         PLAN: Lumbar Transforaminal Steroid Injection               [1] No Known Allergies

## 2024-08-30 NOTE — Procedures [3]
 Attending Surgeon: Royston LELON Bathe, MD    Anesthesia: Local      Transforaminal Lumbar/Sacral THERAPEUTIC  Procedure: transforaminal epidural    Laterality: bilateral   on 08/30/2024 2:00 PM  Location: lumbar - L4-5      Consent:   Consent obtained: written  Consent given by: patient    Discussed with patient the purpose of the treatment/procedure, other ways of treating my condition, including no treatment/ procedure and the risks and benefits of the alternatives. Patient has decided to proceed with treatment/procedure.   Procedures Details:   Indications: pain   Prep: chlorhexidine  Patient position: prone  Estimated Blood Loss: minimal  Specimens: none  Number of Levels: 1  Approach: left paramedian  Guidance: fluoroscopy  Contrast: Procedure confirmed with contrast under live fluoroscopy.  Needle and Epidural Catheter: pencil-tip  Needle size: 25 G  Injection procedure: Incremental injection, Negative aspiration for blood and Introduced needle  Patient tolerance: Patient tolerated the procedure well with no immediate complications. Pressure was applied, and hemostasis was accomplished.  Outcome: Pain relieved and Pain improved  Comments: 1ml bupi and 40mg  depomedrol inj bilaterally    This patient's clinical history, exam, AND imaging support radiculopathy AND there is a significant impact on quality of life and function AND the pain has been present for at least 4 weeks AND they have failed to improve with noninvasive conservative care.  This patient had at least 50% pain relief for at least 3 months with the last epidural injection.        Estimated blood loss: none or minimal  Specimens: none  Patient tolerated the procedure well with no immediate complications. Pressure was applied, and hemostasis was accomplished.

## 2024-09-07 ENCOUNTER — Encounter: Admit: 2024-09-07 | Discharge: 2024-09-07 | Payer: MEDICARE

## 2024-09-07 NOTE — Telephone Encounter [36]
 Discussed stress test instructions and DTL of testing with patient. He does not have any further questions or concerns at this time.

## 2024-09-10 ENCOUNTER — Encounter: Admit: 2024-09-10 | Discharge: 2024-09-10 | Payer: MEDICARE

## 2024-09-10 ENCOUNTER — Ambulatory Visit: Admit: 2024-09-10 | Discharge: 2024-09-10 | Payer: MEDICARE

## 2024-11-19 ENCOUNTER — Encounter: Admit: 2024-11-19 | Discharge: 2024-11-19 | Payer: MEDICARE
